# Patient Record
Sex: Female | Born: 1975 | Race: White | Hispanic: No | Marital: Married | Smoking: Never smoker
Health system: Southern US, Community
[De-identification: ages and names within clinical notes are randomized; demographics above are authoritative.]

## PROBLEM LIST (undated history)

## (undated) DIAGNOSIS — R519 Headache, unspecified: Secondary | ICD-10-CM

## (undated) DIAGNOSIS — K219 Gastro-esophageal reflux disease without esophagitis: Secondary | ICD-10-CM

## (undated) DIAGNOSIS — K7581 Nonalcoholic steatohepatitis (NASH): Secondary | ICD-10-CM

## (undated) DIAGNOSIS — E039 Hypothyroidism, unspecified: Secondary | ICD-10-CM

## (undated) DIAGNOSIS — E119 Type 2 diabetes mellitus without complications: Secondary | ICD-10-CM

## (undated) DIAGNOSIS — C801 Malignant (primary) neoplasm, unspecified: Secondary | ICD-10-CM

## (undated) HISTORY — PX: MASTECTOMY: SHX3

## (undated) HISTORY — PX: TUBAL LIGATION: SHX77

---

## 2007-07-25 HISTORY — PX: EYE SURGERY: SHX253

## 2014-11-24 ENCOUNTER — Other Ambulatory Visit: Payer: Self-pay | Admitting: Internal Medicine

## 2014-11-24 DIAGNOSIS — R222 Localized swelling, mass and lump, trunk: Secondary | ICD-10-CM

## 2014-12-10 ENCOUNTER — Ambulatory Visit: Admission: RE | Admit: 2014-12-10 | Payer: 59 | Source: Ambulatory Visit

## 2015-06-22 ENCOUNTER — Emergency Department
Admission: EM | Admit: 2015-06-22 | Discharge: 2015-06-22 | Disposition: A | Discharge status: Home / Self Care | Payer: 59 | Attending: Emergency Medicine | Admitting: Emergency Medicine

## 2015-06-22 ENCOUNTER — Encounter: Payer: Self-pay | Admitting: *Deleted

## 2015-06-22 DIAGNOSIS — Z23 Encounter for immunization: Secondary | ICD-10-CM | POA: Insufficient documentation

## 2015-06-22 DIAGNOSIS — W260XXA Contact with knife, initial encounter: Secondary | ICD-10-CM | POA: Insufficient documentation

## 2015-06-22 DIAGNOSIS — Y93G1 Activity, food preparation and clean up: Secondary | ICD-10-CM | POA: Insufficient documentation

## 2015-06-22 DIAGNOSIS — Y998 Other external cause status: Secondary | ICD-10-CM | POA: Insufficient documentation

## 2015-06-22 DIAGNOSIS — Y92009 Unspecified place in unspecified non-institutional (private) residence as the place of occurrence of the external cause: Secondary | ICD-10-CM | POA: Diagnosis not present

## 2015-06-22 DIAGNOSIS — S61213A Laceration without foreign body of left middle finger without damage to nail, initial encounter: Secondary | ICD-10-CM | POA: Insufficient documentation

## 2015-06-22 DIAGNOSIS — S61219A Laceration without foreign body of unspecified finger without damage to nail, initial encounter: Secondary | ICD-10-CM

## 2015-06-22 MED ORDER — LIDOCAINE HCL (PF) 1 % IJ SOLN
INTRAMUSCULAR | Status: AC
  Filled 2015-06-22: qty 5

## 2015-06-22 MED ORDER — TETANUS-DIPHTH-ACELL PERTUSSIS 5-2.5-18.5 LF-MCG/0.5 IM SUSP: 0.5000 mL | Freq: Once | INTRAMUSCULAR | Status: DC

## 2015-06-22 MED ORDER — LIDOCAINE HCL (PF) 1 % IJ SOLN
10.0000 mL | Freq: Once | INTRAMUSCULAR | Status: AC
  Administered 2015-06-22: 10 mL

## 2015-06-22 MED ORDER — TETANUS-DIPHTHERIA TOXOIDS TD 5-2 LFU IM INJ
0.5000 mL | INJECTION | Freq: Once | INTRAMUSCULAR | Status: AC
  Administered 2015-06-22: 0.5 mL via INTRAMUSCULAR

## 2015-06-22 MED ORDER — TETANUS-DIPHTHERIA TOXOIDS TD 5-2 LFU IM INJ
INJECTION | INTRAMUSCULAR | Status: AC
  Administered 2015-06-22: 0.5 mL via INTRAMUSCULAR
  Filled 2015-06-22: qty 0.5

## 2015-06-22 NOTE — ED Notes (Signed)
Lac eration to left middle finger cut on knife at home

## 2015-06-22 NOTE — ED Provider Notes (Signed)
[facility] Emergency Department Provider Note  ____________________________________________  Time seen: Approximately 9:19 AM  I have reviewed the triage vital signs and the nursing notes.   HISTORY  Chief Complaint Extremity Laceration    HPI Madison Johnson is a 39 y.o. female who presents emergency department for laceration to left middle finger. She states that she was cutting food at home with a sharp knife when she cut her left middle finger in 2 places. She states that she was able control bleeding prior to arrival. She denies any numbness or tingling in extremity. She endorses full range of motion to her finger. She states that last tetanus shot was greater than 6 years ago. At this time there is minimal pain.   History reviewed. No pertinent past medical history.  There are no active problems to display for this patient.   History reviewed. No pertinent past surgical history.  No current outpatient prescriptions on file.  Allergies Review of patient's allergies indicates not on file.  No family history on file.  Social History Social History  Substance Use Topics  . Smoking status: Never Smoker   . Smokeless tobacco: None  . Alcohol Use: Yes    Review of Systems Constitutional: No fever/chills Eyes: No visual changes. ENT: No sore throat. Cardiovascular: Denies chest pain. Respiratory: Denies shortness of breath. Gastrointestinal: No abdominal pain.  No nausea, no vomiting.  No diarrhea.  No constipation. Genitourinary: Negative for dysuria. Musculoskeletal: Negative for back pain. Skin: Negative for rash. Laceration to left third digit. Neurological: Negative for headaches, focal weakness or numbness.  10-point ROS otherwise negative.  ____________________________________________   PHYSICAL EXAM:  VITAL SIGNS: ED Triage Vitals  Enc Vitals Group     BP --      Pulse --      Resp --      Temp --      Temp src --       SpO2 --      Weight --      Height --      Head Cir --      Peak Flow --      Pain Score --      Pain Loc --      Pain Edu? --      Excl. in [place]? --     Constitutional: Alert and oriented. Well appearing and in no acute distress. Eyes: Conjunctivae are normal. PERRL. EOMI. Head: Atraumatic. Nose: No congestion/rhinnorhea. Mouth/Throat: Mucous membranes are moist.  Oropharynx non-erythematous. Neck: No stridor.   Cardiovascular: Normal rate, regular rhythm. Grossly normal heart sounds.  Good peripheral circulation. Respiratory: Normal respiratory effort.  No retractions. Lungs CTAB. Gastrointestinal: Soft and nontender. No distention. No abdominal bruits. No CVA tenderness. Musculoskeletal: No lower extremity tenderness nor edema.  No joint effusions. Neurologic:  Normal speech and language. No gross focal neurologic deficits are appreciated. No gait instability. Skin:  Skin is warm, dry and intact. No rash noted. 2 lacerations noted to the lateral aspect of the left third digit. First laceration PIP joint. Full range of motion in joint. Laceration margins are clean, no foreign body visible, no bleeding. Laceration is approximately 4 cm in length. Second laceration is over the distal phalanx. No tendon involvement, edges are clean, no visible foreign body. Bleeding is controlled. Laceration is approximately 2 cm in length. Psychiatric: Mood and affect are normal. Speech and behavior are normal.  ____________________________________________   LABS (all labs ordered are listed, but only  abnormal results are displayed)  Labs Reviewed - No data to display ____________________________________________  EKG   ____________________________________________  RADIOLOGY   ____________________________________________   PROCEDURES  Procedure(s) performed: Yes, laceration repair, see procedure note(s).   LACERATION REPAIR Performed by: Darletta Moll Authorized by: Charline Bills Destan Franchini Consent: Verbal consent obtained. Risks and benefits: risks, benefits and alternatives were discussed Consent given by: patient Patient identity confirmed: provided demographic data Prepped and Draped in normal sterile fashion Wound explored  Laceration Location: The third digit left hand, over PIP joint  Laceration Length: For cm  No Foreign Bodies seen or palpated  Anesthesia: Digital block   Local anesthetic: lidocaine 1 % without epinephrine  Anesthetic total: 5 ml  Irrigation method: syringe Amount of cleaning: standard  Skin closure: 4-0 Ethilon sutures   Number of sutures: 7   Technique: Simple interrupted   Patient tolerance: Patient tolerated the procedure well with no immediate complications.  LACERATION REPAIR Performed by: Darletta Moll Authorized by: Charline Bills Trevino Wyatt Consent: Verbal consent obtained. Risks and benefits: risks, benefits and alternatives were discussed Consent given by: patient Patient identity confirmed: provided demographic data Prepped and Draped in normal sterile fashion Wound explored  Laceration Location: Left third digit, over distal phalanx  Laceration Length: 2 cm  No Foreign Bodies seen or palpated  Anesthesia: Digital block   Local anesthetic: lidocaine 1 % without epinephrine  Anesthetic total: 5 ml  Irrigation method: syringe Amount of cleaning: standard  Skin closure: 4-0 Ethilon sutures   Number of sutures: 3   Technique: Supple interrupted   Patient tolerance: Patient tolerated the procedure well with no immediate complications.    Critical Care performed: No  ____________________________________________   INITIAL IMPRESSION / ASSESSMENT AND PLAN / ED COURSE  Pertinent labs & imaging results that were available during my care of the patient were reviewed by me and considered in my medical decision making (see chart for details).  Vision presents emergency department with 2  lacerations to the left third digit. Wound was cleansed and inspected no visible foreign body. Wounds were closed with 4-0 Ethilon sutures. Proximal laceration was closed with 7 simple interrupted sutures, distal laceration was closed with 3 simple interrupted sutures. Patient was advised to keep area clean and dry and monitor for signs of infection. Patient is to follow-up with primary care or urgent care for suture removal in 7-10 days. Patient verbalizes understanding of diagnosis and treatment plan and verbalizes compliance to same. ____________________________________________   FINAL CLINICAL IMPRESSION(S) / ED DIAGNOSES  Final diagnoses:  Laceration of finger, initial encounter      Darletta Moll, PA-C 06/22/15 [place], MD 06/22/15 1406

## 2015-06-22 NOTE — ED Notes (Signed)
Lac to left middle finger

## 2015-06-22 NOTE — Discharge Instructions (Signed)
Laceration Care, Adult °A laceration is a cut that goes through all of the layers of the skin and into the tissue that is right under the skin. Some lacerations heal on their own. Others need to be closed with stitches (sutures), staples, skin adhesive strips, or skin glue. Proper laceration care minimizes the risk of infection and helps the laceration to heal better. °HOW TO CARE FOR YOUR LACERATION °If sutures or staples were used: °· Keep the wound clean and dry. °· If you were given a bandage (dressing), you should change it at least one time per day or as told by your health care provider. You should also change it if it becomes wet or dirty. °· Keep the wound completely dry for the first 24 hours or as told by your health care provider. After that time, you may shower or bathe. However, make sure that the wound is not soaked in water until after the sutures or staples have been removed. °· Clean the wound one time each day or as told by your health care provider: °· Wash the wound with soap and water. °· Rinse the wound with water to remove all soap. °· Pat the wound dry with a clean towel. Do not rub the wound. °· After cleaning the wound, apply a thin layer of antibiotic ointment as told by your health care provider. This will help to prevent infection and keep the dressing from sticking to the wound. °· Have the sutures or staples removed as told by your health care provider. °If skin adhesive strips were used: °· Keep the wound clean and dry. °· If you were given a bandage (dressing), you should change it at least one time per day or as told by your health care provider. You should also change it if it becomes dirty or wet. °· Do not get the skin adhesive strips wet. You may shower or bathe, but be careful to keep the wound dry. °· If the wound gets wet, pat it dry with a clean towel. Do not rub the wound. °· Skin adhesive strips fall off on their own. You may trim the strips as the wound heals. Do not  remove skin adhesive strips that are still stuck to the wound. They will fall off in time. °If skin glue was used: °· Try to keep the wound dry, but you may briefly wet it in the shower or bath. Do not soak the wound in water, such as by swimming. °· After you have showered or bathed, gently pat the wound dry with a clean towel. Do not rub the wound. °· Do not do any activities that will make you sweat heavily until the skin glue has fallen off on its own. °· Do not apply liquid, cream, or ointment medicine to the wound while the skin glue is in place. Using those may loosen the film before the wound has healed. °· If you were given a bandage (dressing), you should change it at least one time per day or as told by your health care provider. You should also change it if it becomes dirty or wet. °· If a dressing is placed over the wound, be careful not to apply tape directly over the skin glue. Doing that may cause the glue to be pulled off before the wound has healed. °· Do not pick at the glue. The skin glue usually remains in place for 5-10 days, then it falls off of the skin. °General Instructions °· Take over-the-counter and prescription   medicines only as told by your health care provider. °· If you were prescribed an antibiotic medicine or ointment, take or apply it as told by your doctor. Do not stop using it even if your condition improves. °· To help prevent scarring, make sure to cover your wound with sunscreen whenever you are outside after stitches are removed, after adhesive strips are removed, or when glue remains in place and the wound is healed. Make sure to wear a sunscreen of at least 30 SPF. °· Do not scratch or pick at the wound. °· Keep all follow-up visits as told by your health care provider. This is important. °· Check your wound every day for signs of infection. Watch for: °· Redness, swelling, or pain. °· Fluid, blood, or pus. °· Raise (elevate) the injured area above the level of your heart  while you are sitting or lying down, if possible. °SEEK MEDICAL CARE IF: °· You received a tetanus shot and you have swelling, severe pain, redness, or bleeding at the injection site. °· You have a fever. °· A wound that was closed breaks open. °· You notice a bad smell coming from your wound or your dressing. °· You notice something coming out of the wound, such as wood or glass. °· Your pain is not controlled with medicine. °· You have increased redness, swelling, or pain at the site of your wound. °· You have fluid, blood, or pus coming from your wound. °· You notice a change in the color of your skin near your wound. °· You need to change the dressing frequently due to fluid, blood, or pus draining from the wound. °· You develop a new rash. °· You develop numbness around the wound. °SEEK IMMEDIATE MEDICAL CARE IF: °· You develop severe swelling around the wound. °· Your pain suddenly increases and is severe. °· You develop painful lumps near the wound or on skin that is anywhere on your body. °· You have a red streak going away from your wound. °· The wound is on your hand or foot and you cannot properly move a finger or toe. °· The wound is on your hand or foot and you notice that your fingers or toes look pale or bluish. °  °This information is not intended to replace advice given to you by your health care provider. Make sure you discuss any questions you have with your health care provider. °  °Document Released: 07/10/2005 Document Revised: 11/24/2014 Document Reviewed: 07/06/2014 °Elsevier Interactive Patient Education ©2016 [employer] ° °Stitches, Staples, or Adhesive Wound Closure °Health care providers use stitches (sutures), staples, and certain glue (skin adhesives) to hold skin together while it heals (wound closure). You may need this treatment after you have surgery or if you cut your skin accidentally. These methods help your skin to heal more quickly and make it less likely that you will have  a scar. A wound may take several months to heal completely. °The type of wound you have determines when your wound gets closed. In most cases, the wound is closed as soon as possible (primary skin closure). Sometimes, closure is delayed so the wound can be cleaned and allowed to heal naturally. This reduces the chance of infection. Delayed closure may be needed if your wound: °· Is caused by a bite. °· Happened more than 6 hours ago. °· Involves loss of skin or the tissues under the skin. °· Has dirt or debris in it that cannot be removed. °· Is infected. °WHAT   ARE THE DIFFERENT KINDS OF WOUND CLOSURES? °There are many options for wound closure. The one that your health care provider uses depends on how deep and how large your wound is. °Adhesive Glue °To use this type of glue to close a wound, your health care provider holds the edges of the wound together and paints the glue on the surface of your skin. You may need more than one layer of glue. Then the wound may be covered with a light bandage (dressing). °This type of skin closure may be used for small wounds that are not deep (superficial). Using glue for wound closure is less painful than other methods. It does not require a medicine that numbs the area (local anesthetic). This method also leaves nothing to be removed. Adhesive glue is often used for children and on facial wounds. °Adhesive glue cannot be used for wounds that are deep, uneven, or bleeding. It is not used inside of a wound.  °Adhesive Strips °These strips are made of sticky (adhesive), porous paper. They are applied across your skin edges like a regular adhesive bandage. You leave them on until they fall off. °Adhesive strips may be used to close very superficial wounds. They may also be used along with sutures to improve the closure of your skin edges.  °Sutures °Sutures are the oldest method of wound closure. Sutures can be made from natural substances, such as silk, or from synthetic  materials, such as nylon and steel. They can be made from a material that your body can break down as your wound heals (absorbable), or they can be made from a material that needs to be removed from your skin (nonabsorbable). They come in many different strengths and sizes. °Your health care provider attaches the sutures to a steel needle on one end. Sutures can be passed through your skin, or through the tissues beneath your skin. Then they are tied and cut. Your skin edges may be closed in one continuous stitch or in separate stitches. °Sutures are strong and can be used for all kinds of wounds. Absorbable sutures may be used to close tissues under the skin. The disadvantage of sutures is that they may cause skin reactions that lead to infection. Nonabsorbable sutures need to be removed. °Staples °When surgical staples are used to close a wound, the edges of your skin on both sides of the wound are brought close together. A staple is placed across the wound, and an instrument secures the edges together. Staples are often used to close surgical cuts (incisions). °Staples are faster to use than sutures, and they cause less skin reaction. Staples need to be removed using a tool that bends the staples away from your skin. °HOW DO I CARE FOR MY WOUND CLOSURE? °· Take medicines only as directed by your health care provider. °· If you were prescribed an antibiotic medicine for your wound, finish it all even if you start to feel better. °· Use ointments or creams only as directed by your health care provider. °· Wash your hands with soap and water before and after touching your wound. °· Do not soak your wound in water. Do not take baths, swim, or use a hot tub until your health care provider approves. °· Ask your health care provider when you can start showering. Cover your wound if directed by your health care provider. °· Do not take out your own sutures or staples. °· Do not pick at your wound. Picking can cause an  infection. °·   Keep all follow-up visits as directed by your health care provider. This is important. °HOW LONG WILL I HAVE MY WOUND CLOSURE? °· Leave adhesive glue on your skin until the glue peels away. °· Leave adhesive strips on your skin until the strips fall off. °· Absorbable sutures will dissolve within several days. °· Nonabsorbable sutures and staples must be removed. The location of the wound will determine how long they stay in. This can range from several days to a couple of weeks. °WHEN SHOULD I SEEK HELP FOR MY WOUND CLOSURE? °Contact your health care provider if: °· You have a fever. °· You have chills. °· You have drainage, redness, swelling, or pain at your wound. °· There is a bad smell coming from your wound. °· The skin edges of your wound start to separate after your sutures have been removed. °· Your wound becomes thick, raised, and darker in color after your sutures come out (scarring). °  °This information is not intended to replace advice given to you by your health care provider. Make sure you discuss any questions you have with your health care provider. °  °Document Released: 04/04/2001 Document Revised: 07/31/2014 Document Reviewed: 12/17/2013 °Elsevier Interactive Patient Education ©2016 [employer] ° °

## 2015-06-22 NOTE — ED Notes (Signed)
dsy to left middle finger

## 2015-10-29 ENCOUNTER — Other Ambulatory Visit: Payer: Self-pay | Admitting: Internal Medicine

## 2015-10-29 DIAGNOSIS — Z0000 Encounter for general adult medical examination without abnormal findings: Secondary | ICD-10-CM

## 2015-11-15 ENCOUNTER — Ambulatory Visit
Admission: RE | Admit: 2015-11-15 | Discharge: 2015-11-15 | Disposition: A | Discharge status: Home / Self Care | Payer: 59 | Source: Ambulatory Visit | Attending: Internal Medicine | Admitting: Internal Medicine

## 2015-11-15 DIAGNOSIS — R921 Mammographic calcification found on diagnostic imaging of breast: Secondary | ICD-10-CM | POA: Insufficient documentation

## 2015-11-15 DIAGNOSIS — Z0000 Encounter for general adult medical examination without abnormal findings: Secondary | ICD-10-CM

## 2015-11-15 DIAGNOSIS — Z1231 Encounter for screening mammogram for malignant neoplasm of breast: Secondary | ICD-10-CM | POA: Diagnosis present

## 2015-11-17 ENCOUNTER — Other Ambulatory Visit: Payer: Self-pay | Admitting: Internal Medicine

## 2015-11-17 DIAGNOSIS — R928 Other abnormal and inconclusive findings on diagnostic imaging of breast: Secondary | ICD-10-CM

## 2015-11-23 ENCOUNTER — Other Ambulatory Visit: Payer: Self-pay | Admitting: Internal Medicine

## 2015-11-23 ENCOUNTER — Ambulatory Visit
Admission: RE | Admit: 2015-11-23 | Discharge: 2015-11-23 | Disposition: A | Discharge status: Home / Self Care | Payer: 59 | Source: Ambulatory Visit | Attending: Internal Medicine | Admitting: Internal Medicine

## 2015-11-23 DIAGNOSIS — R928 Other abnormal and inconclusive findings on diagnostic imaging of breast: Secondary | ICD-10-CM

## 2015-11-23 DIAGNOSIS — R921 Mammographic calcification found on diagnostic imaging of breast: Secondary | ICD-10-CM | POA: Diagnosis present

## 2015-11-24 ENCOUNTER — Other Ambulatory Visit: Payer: Self-pay | Admitting: Internal Medicine

## 2015-11-24 DIAGNOSIS — R920 Mammographic microcalcification found on diagnostic imaging of breast: Secondary | ICD-10-CM

## 2015-11-24 DIAGNOSIS — N63 Unspecified lump in unspecified breast: Secondary | ICD-10-CM

## 2015-12-02 ENCOUNTER — Ambulatory Visit
Admission: RE | Admit: 2015-12-02 | Discharge: 2015-12-02 | Disposition: A | Discharge status: Home / Self Care | Payer: 59 | Source: Ambulatory Visit | Attending: Internal Medicine | Admitting: Internal Medicine

## 2015-12-02 DIAGNOSIS — N63 Unspecified lump in unspecified breast: Secondary | ICD-10-CM

## 2015-12-02 DIAGNOSIS — R920 Mammographic microcalcification found on diagnostic imaging of breast: Secondary | ICD-10-CM

## 2015-12-02 DIAGNOSIS — D0581 Other specified type of carcinoma in situ of right breast: Secondary | ICD-10-CM | POA: Diagnosis not present

## 2015-12-02 DIAGNOSIS — R921 Mammographic calcification found on diagnostic imaging of breast: Secondary | ICD-10-CM | POA: Diagnosis present

## 2015-12-02 HISTORY — PX: BREAST BIOPSY: SHX20

## 2015-12-03 DIAGNOSIS — Z853 Personal history of malignant neoplasm of breast: Secondary | ICD-10-CM | POA: Insufficient documentation

## 2015-12-03 LAB — SURGICAL PATHOLOGY

## 2015-12-07 ENCOUNTER — Other Ambulatory Visit: Payer: Self-pay | Admitting: Internal Medicine

## 2015-12-07 ENCOUNTER — Other Ambulatory Visit (HOSPITAL_COMMUNITY): Payer: Self-pay | Admitting: Internal Medicine

## 2015-12-07 DIAGNOSIS — R1032 Left lower quadrant pain: Principal | ICD-10-CM

## 2015-12-07 DIAGNOSIS — R1031 Right lower quadrant pain: Secondary | ICD-10-CM

## 2015-12-08 NOTE — Progress Notes (Signed)
  Oncology Nurse Navigator Documentation  Navigator Location: [facility]Med Onc (12/08/15 1600) Navigator Encounter Type: Initial MedOnc (12/08/15 1600)   Abnormal Finding Date: 11/23/15 (12/08/15 1600) Confirmed Diagnosis Date: 12/02/15 (12/08/15 1600)     Patient Visit Type: Walk-in (12/08/15 1600) Treatment Phase: Pre-Tx/Tx Discussion (12/08/15 1600) Barriers/Navigation Needs: Financial;Education;Coordination of Care (12/08/15 1600) Education: Coping with Diagnosis/ Prognosis;Newly Diagnosed Cancer Education;Concerns with Finances/ Eligibility;lymphedema class (12/08/15 1600) Interventions: Education Method;Coordination of Care (12/08/15 1600)        Support Groups/Services: Kids Can;Breast Support Group (12/08/15 1600)   Acuity: Level 2 (12/08/15 1600)   Acuity Level 2: Ongoing guidance and education throughout treatment as needed;Initial guidance, education and coordination as needed (12/08/15 1600)     Time Spent with Patient: 30 (12/08/15 1600)   Tanya Nones RN phoned patient to introduce navigation service. Patient came today to pick up Breast Cancer Treatment Handbook/folder with hospital services. She is scheduled for [place] on 12/14/15 with Dr. Grayland Ormond.  Has concerns over children understanding diagnosis.  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   Encouraged her to bring spouse to initial Med-Onc appointment to increase his understanding of treatment plan.  Navigatorto accompany to this appointment.

## 2015-12-14 ENCOUNTER — Encounter: Payer: Self-pay | Admitting: *Deleted

## 2015-12-14 ENCOUNTER — Encounter: Payer: Self-pay | Admitting: Oncology

## 2015-12-14 ENCOUNTER — Inpatient Hospital Stay: Payer: 59 | Attending: Oncology | Admitting: Oncology

## 2015-12-14 VITALS — BP 124/83 | HR 78 | Temp 98.1°F | Resp 18 | Wt 163.1 lb

## 2015-12-14 DIAGNOSIS — Z800 Family history of malignant neoplasm of digestive organs: Secondary | ICD-10-CM | POA: Insufficient documentation

## 2015-12-14 DIAGNOSIS — D0511 Intraductal carcinoma in situ of right breast: Secondary | ICD-10-CM | POA: Insufficient documentation

## 2015-12-14 DIAGNOSIS — R921 Mammographic calcification found on diagnostic imaging of breast: Secondary | ICD-10-CM | POA: Diagnosis not present

## 2015-12-14 NOTE — Progress Notes (Signed)
[place]  Telephone:(336) [phone] Fax:(336) [phone]  ID: Madison Johnson OB: 04/07/1976  MR#: [id number]  HXT#:[id number]  Patient Care Team: No Pcp Per Patient as PCP - General (General Practice)  CHIEF COMPLAINT:  Chief Complaint  Patient presents with  . New Evaluation    INTERVAL HISTORY: Patient is a 40 year old female who had a routine yearly follow-up with her primary care physician and noted an abnormality on breast exam. 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 She also had an abnormality in her left breast, biopsy was negative for malignancy. She is referred to clinic today for further evaluation and treatment options. She currently feels well and is asymptomatic. She has no neurologic complaints. She denies any recent fevers or illnesses. She has a good appetite and denies any weight loss. She denies any pain. She denies any chest pain or shortness of breath. She denies any nausea, vomiting, constipation, or diarrhea. She has no urinary complaints. Patient feels at her baseline and offers no specific complaints today.  REVIEW OF SYSTEMS:   Review of Systems  Constitutional: Negative.  Negative for fever, weight loss and malaise/fatigue.  Respiratory: Negative.  Negative for shortness of breath.   Cardiovascular: Negative.  Negative for chest pain.  Gastrointestinal: Negative.  Negative for abdominal pain.  Genitourinary: Negative.   Musculoskeletal: Negative.   Neurological: Negative.  Negative for weakness.  Psychiatric/Behavioral: Negative.     As per HPI. Otherwise, a complete review of systems is negatve.  PAST MEDICAL HISTORY: No past medical history on file.  PAST SURGICAL HISTORY: Past Surgical History  Procedure Laterality Date  . Breast biopsy Right 12/02/2015    path pending, stereo x2 areas  . Breast biopsy Left 12/02/2015    [place] bx path pending  . Tubal ligation      FAMILY  HISTORY:Grandfather and maternal aunt with stomach cancer.     ADVANCED DIRECTIVES:    HEALTH MAINTENANCE: Social History  Substance Use Topics  . Smoking status: Never Smoker   . Smokeless tobacco: Not on file  . Alcohol Use: Yes     Colonoscopy:  PAP:  Bone density:  Lipid panel:  No Known Allergies  No current outpatient prescriptions on file.   No current facility-administered medications for this visit.    OBJECTIVE: Filed Vitals:   12/14/15 0946  BP: 124/83  Pulse: 78  Temp: 98.1 F (36.7 C)  Resp: 18     Body mass index is 31.86 kg/(m^2).    ECOG FS:0 - Asymptomatic  General: Well-developed, well-nourished, no acute distress. Eyes: Pink conjunctiva, anicteric sclera. HEENT: Normocephalic, moist mucous membranes, clear oropharnyx. Breasts: Patient requested exam be deferred today. Lungs: Clear to auscultation bilaterally. Heart: Regular rate and rhythm. No rubs, murmurs, or gallops. Abdomen: Soft, nontender, nondistended. No organomegaly noted, normoactive bowel sounds. Musculoskeletal: No edema, cyanosis, or clubbing. Neuro: Alert, answering all questions appropriately. Cranial nerves grossly intact. Skin: No rashes or petechiae noted. Psych: Normal affect. Lymphatics: No cervical, calvicular, axillary or inguinal LAD.   LAB RESULTS:  No results found for: NA, K, CL, CO2, GLUCOSE, BUN, CREATININE, CALCIUM, PROT, ALBUMIN, AST, ALT, ALKPHOS, BILITOT, GFRNONAA, GFRAA  No results found for: WBC, NEUTROABS, HGB, HCT, MCV, PLT   STUDIES: Mm Digital Diagnostic Bilat  12/02/2015  CLINICAL DATA:  Status post stereotactic guided biopsies of the right breast and ultrasound-guided biopsy of the left breast EXAM: DIAGNOSTIC BILATERAL MAMMOGRAM POST STEREOTACTIC AND ULTRASOUND BIOPSIES COMPARISON:  Previous  exam(s). FINDINGS: Mammographic images were obtained following stereotactic guided biopsies of 2 groups of calcifications within the right breast and  ultrasound-guided biopsy of an indeterminate left breast mass. Post biopsy mammogram demonstrates the top hat shaped biopsy marker to be in the expected location within the upper, outer right breast, the cylinder-shaped biopsy marker to be in the expected location within the lower, outer right breast, and the wing shaped biopsy marker to be in the expected location within the medial left breast. IMPRESSION: Appropriate marker position as above. Final Assessment: Post Procedure Mammograms for Marker Placement Electronically Signed   By: Pamelia Hoit M.D.   On: 12/02/2015 13:13   Mm Digital Screening Bilateral  11/15/2015  CLINICAL DATA:  Screening. EXAM: DIGITAL SCREENING BILATERAL MAMMOGRAM WITH CAD COMPARISON:  None ACR Breast Density Category b: There are scattered areas of fibroglandular density. FINDINGS: In the right breast, calcifications warrant further evaluation with magnified views. In the left breast, no findings suspicious for malignancy. Images were processed with CAD. IMPRESSION: Further evaluation is suggested for calcifications in the right breast. RECOMMENDATION: Diagnostic mammogram of the right breast. (Code:[id number]) The patient will be contacted regarding the findings, and additional imaging will be scheduled. BI-RADS CATEGORY  0: Incomplete. Need additional imaging evaluation and/or prior mammograms for comparison. Electronically Signed   By: Lajean Manes M.D.   On: 11/15/2015 12:23   US Breast Ltd Uni Left Inc Axilla  11/23/2015  CLINICAL DATA:  Recall from baseline screening mammography. EXAM: 2D DIGITAL DIAGNOSTIC BILATERAL MAMMOGRAM WITH CAD AND ADJUNCT TOMO ULTRASOUND LEFT BREAST COMPARISON:  11/15/2015 baseline screening mammogram. ACR Breast Density Category b: There are scattered areas of fibroglandular density. FINDINGS: Magnification views of the right breast demonstrate linear, branching, pleomorphic segmental calcifications to be present within the upper-outer quadrant of the  right breast. These span 4.0 cm. In addition, there are similar, linear, branching, pleomorphic segmental calcifications present within the lower outer quadrant of the right breast which span 5.5 cm. These are worrisome for high-grade DCIS and tissue sampling via stereotactic biopsy of representative calcifications from each of these 2 different areas is recommended. In addition, there is an oval mass with an angular anterior margin located within the upper inner quadrant of the left breast (middle 1/3). Mammographic images were processed with CAD. On physical exam, there is no discrete palpable abnormality within the medial left breast or upper inner quadrant of the left breast. Targeted ultrasound is performed, showing an oval, parallel hypoechoic lesion with septations and multiple cystic areas located within the left breast at the 9:30 o'clock position 7 cm from the nipple measuring 7 x 6 x 3 mm in size. Approximately 1 cm superior to this is a second similar oval, septated, cystic complex located at the 10 o'clock position 7 cm from the nipple measuring 8 x 6 x 5 mm in size. These may represent areas of apocrine metaplasia [facility]). However, given the calcifications within the right breast, I recommend ultrasound-guided core biopsy of 1 of these 2 adjacent lesions to exclude DCIS. Ultrasound of the right and left axillary regions demonstrates normal axillary contents and no evidence for adenopathy. IMPRESSION: 1. 2 large areas of worrisome segmental branching pleomorphic calcifications. One of these groups of calcifications is located within the upper-outer quadrant of the right breast (spanning 4 cm) and the second group is located within the lower outer quadrant of the right breast (spanning 5.5 cm). Stereotactic biopsy of each of these groups of calcifications is recommended. This has been discussed  with the patient. 2. Adjacent complex cystic lesions located within the left breast at the 9:30 and 10 o'clock  positions 7 cm from the nipple. Tissue sampling of 1 of these areas via ultrasound-guided core biopsy is recommended. RECOMMENDATION: 1. Stereotactic biopsy of 2 areas of calcification within the right breast. 2. Ultrasound-guided core biopsy of 1 of the adjacent complex cystic lesions within the left breast located at the 9:30 to 10 o'clock position 7 cm from the nipple. I have discussed the findings and recommendations with the patient. Results were also provided in writing at the conclusion of the visit. If applicable, a reminder letter will be sent to the patient regarding the next appointment. BI-RADS CATEGORY  5: Highly suggestive of malignancy. Electronically Signed   By: Altamese Cabal M.D.   On: 11/23/2015 14:35   Mm Diag Breast Tomo Bilateral  11/23/2015  CLINICAL DATA:  Recall from baseline screening mammography. EXAM: 2D DIGITAL DIAGNOSTIC BILATERAL MAMMOGRAM WITH CAD AND ADJUNCT TOMO ULTRASOUND LEFT BREAST COMPARISON:  11/15/2015 baseline screening mammogram. ACR Breast Density Category b: There are scattered areas of fibroglandular density. FINDINGS: Magnification views of the right breast demonstrate linear, branching, pleomorphic segmental calcifications to be present within the upper-outer quadrant of the right breast. These span 4.0 cm. In addition, there are similar, linear, branching, pleomorphic segmental calcifications present within the lower outer quadrant of the right breast which span 5.5 cm. These are worrisome for high-grade DCIS and tissue sampling via stereotactic biopsy of representative calcifications from each of these 2 different areas is recommended. In addition, there is an oval mass with an angular anterior margin located within the upper inner quadrant of the left breast (middle 1/3). Mammographic images were processed with CAD. On physical exam, there is no discrete palpable abnormality within the medial left breast or upper inner quadrant of the left breast. Targeted  ultrasound is performed, showing an oval, parallel hypoechoic lesion with septations and multiple cystic areas located within the left breast at the 9:30 o'clock position 7 cm from the nipple measuring 7 x 6 x 3 mm in size. Approximately 1 cm superior to this is a second similar oval, septated, cystic complex located at the 10 o'clock position 7 cm from the nipple measuring 8 x 6 x 5 mm in size. These may represent areas of apocrine metaplasia [facility]). However, given the calcifications within the right breast, I recommend ultrasound-guided core biopsy of 1 of these 2 adjacent lesions to exclude DCIS. Ultrasound of the right and left axillary regions demonstrates normal axillary contents and no evidence for adenopathy. IMPRESSION: 1. 2 large areas of worrisome segmental branching pleomorphic calcifications. One of these groups of calcifications is located within the upper-outer quadrant of the right breast (spanning 4 cm) and the second group is located within the lower outer quadrant of the right breast (spanning 5.5 cm). Stereotactic biopsy of each of these groups of calcifications is recommended. This has been discussed with the patient. 2. Adjacent complex cystic lesions located within the left breast at the 9:30 and 10 o'clock positions 7 cm from the nipple. Tissue sampling of 1 of these areas via ultrasound-guided core biopsy is recommended. RECOMMENDATION: 1. Stereotactic biopsy of 2 areas of calcification within the right breast. 2. Ultrasound-guided core biopsy of 1 of the adjacent complex cystic lesions within the left breast located at the 9:30 to 10 o'clock position 7 cm from the nipple. I have discussed the findings and recommendations with the patient. Results were also provided in writing  at the conclusion of the visit. If applicable, a reminder letter will be sent to the patient regarding the next appointment. BI-RADS CATEGORY  5: Highly suggestive of malignancy. Electronically Signed   By: Altamese Cabal M.D.   On: 11/23/2015 14:35   [place] Lt Breast Bx W Loc Dev 1st Lesion Img Bx Spec US Guide  12/08/2015  ADDENDUM REPORT: 12/07/2015 16:53 ADDENDUM: Pathology of the left breast ultrasound guided biopsy revealed BREAST MASS, LEFT 9:30 AND 7 CM FN; BIOPSY: NODULAR FIBROCYSTIC CHANGE. NEGATIVE FOR ATYPIA AND MALIGNANCY. This was found to be concordant by Dr. Raul Del impression and notes and confirmed by Dr. Jetta Lout. Recommendations: Recommend a six month follow-up left breast ultrasound. The patient was contacted by phone by Jetta Lout, [place] on 12/07/15 for a post biopsy check. She stated she did well following the biopsy with no bleeding, bruising, or hematoma. All of her questions were answered. She stated results had been relayed to her by her physician, Dr. Candiss Norse on Friday, 12/03/15. She has an appointment with Dr. Grayland Ormond at [facility] on Tuesday, 12/14/15 at 10:00 AM for consultation regarding the right breast pathology. Addendum by Jetta Lout, RRA on 12/07/15. Electronically Signed   By: Fidela Salisbury M.D.   On: 12/07/2015 16:53  12/08/2015  CLINICAL DATA:  Indeterminate left breast mass at 9:30, 7 cm from the nipple. EXAM: ULTRASOUND GUIDED LEFT BREAST CORE NEEDLE BIOPSY COMPARISON:  Previous exam(s). FINDINGS: I met with the patient and we discussed the procedure of ultrasound-guided biopsy, including benefits and alternatives. We discussed the high likelihood of a successful procedure. We discussed the risks of the procedure, including infection, bleeding, tissue injury, clip migration, and inadequate sampling. Informed written consent was given. The usual time-out protocol was performed immediately prior to the procedure. Using sterile technique and 1% Lidocaine as local anesthetic, under direct ultrasound visualization, a 12 gauge spring-loaded device was used to perform biopsy of an indeterminate left breast mass at 9:30, 7 cm from the nipple using a medial to lateral  approach. At the conclusion of the procedure a wing shaped tissue marker clip was deployed into the biopsy cavity. Follow up 2 view mammogram was performed and dictated separately. IMPRESSION: Ultrasound guided biopsy of an indeterminate left breast mass at 9:30, 7 cm from the nipple. No apparent complications. Electronically Signed: By: Pamelia Hoit M.D. 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 These findings were communicated to [facility] in Dr. Keturah Barre office on 12/03/2015 by the pathologist. This was found to be concordant with Dr. Raul Del impression and notes. Recommendations:  Surgical and oncology referral. The patient was contacted by phone on 12/07/15 by Jetta Lout, [place] for a post biopsy check. She stated she did well following the biopsy with no bleeding, bruising, or hematoma. All of her questions were answered. She was encouraged to call the [place] [facility] with any further questions or concerns. Results have been given to the patient by Dr. Candiss Norse. The patient stated she has an appointment on Tuesday, Dec 14, 2015 at 10:00 AM with Dr. Grayland Ormond, oncologist at [facility]. Addendum by Jetta Lout, RRA on 12/07/15. Electronically Signed   By: Linwood Dibbles.D.  On: 12/07/2015 16:48  12/08/2015  CLINICAL DATA:  Suspicious right breast calcifications. Biopsy of 2 areas of calcifications within the right breast was recommended. EXAM: RIGHT BREAST STEREOTACTIC CORE NEEDLE BIOPSY COMPARISON:  Previous exams. FINDINGS: The patient and I discussed the procedure of stereotactic-guided biopsy including benefits and alternatives. We discussed the high  likelihood of a successful procedure. We discussed the risks of the procedure including infection, bleeding, tissue injury, clip migration, and inadequate sampling. Informed written consent was given. The usual time out protocol was performed immediately prior to the procedure. Using sterile technique and 1% Lidocaine as local anesthetic, under stereotactic guidance, a 9 gauge vacuum assist device was used to perform core needle biopsy of calcifications in the upper, outer quadrant of the right breast using a lateral to medial approach. Specimen radiograph was performed showing calcifications within the specimen. Specimens with calcifications are identified for pathology. At the conclusion of the procedure, a top hat shaped tissue marker clip was deployed into the biopsy cavity. Follow-up 2-view mammogram was performed and dictated separately. IMPRESSION: Stereotactic-guided biopsy of calcifications within the upper, outer right breast. No apparent complications. Electronically Signed: By: Pamelia Hoit M.D. 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 These findings were communicated to [facility] in Dr. Keturah Barre office on 12/03/2015. This was found to be concordant with Dr. Raul Del impression and notes. Recommendations:  Surgical and oncology referral. The patient was contacted by Jetta Lout, [place] on 12/07/15 for a post biopsy check. She stated she did well following the biopsy with no bleeding, bruising, or hematoma. All of her questions were answered. She was encouraged to call the [place] [facility] with any further questions or concerns. She stated she had been notified of the results  by her physician on Friday, Dec 03, 2015. She has an appointment with Dr. Grayland Ormond, oncologist at [facility] on Tuesday, 12/14/15 at 10:00 AM. Addendum by Jetta Lout, RRA on 12/07/15. Electronically Signed   By: Fidela Salisbury M.D.   On: 12/07/2015 16:47  12/08/2015  CLINICAL DATA:  Suspicious right breast calcifications. Biopsy of 2 areas of calcifications within the right breast was recommended. EXAM: RIGHT BREAST STEREOTACTIC CORE NEEDLE BIOPSY COMPARISON:  Previous exams. FINDINGS: The patient and I discussed the procedure of stereotactic-guided biopsy including benefits and alternatives. We discussed the high likelihood of a successful procedure. We discussed the risks of the procedure including infection, bleeding, tissue injury, clip migration, and inadequate sampling. Informed written consent was given. The usual time out protocol was performed immediately prior to the procedure. Using sterile technique and 1% Lidocaine as local anesthetic, under stereotactic guidance, a 9 gauge vacuum assist device was used to perform core needle biopsy of calcifications in the lower, outer quadrant of the right breast using a lateral to medial approach. Specimen radiograph was performed showing calcifications within the specimen. Specimens with calcifications are identified for pathology. At the conclusion of the procedure, a cylinder-shaped tissue marker clip was deployed into the biopsy cavity. Follow-up 2-view mammogram was performed and dictated separately. IMPRESSION: Stereotactic-guided biopsy of calcifications within the lower, outer right breast. No apparent complications. Electronically Signed: By: Pamelia Hoit M.D. On: 12/02/2015 13:10    ASSESSMENT:  Multifocal right breast DCIS.  PLAN:    1. DCIS: Imaging and pathology discussed at breast conference. Had lengthy discussion with the patient on whether to pursue lumpectomy possibly 2 in the same breast which would also likely  include additional biopsies to ensure the extensive disease seen on mammogram is positive or possibly total mastectomy. Also discussed her options of immediate reconstruction as well as consideration of bilateral mastectomy. Patient has indicated that she will likely pursue mastectomy, but will further discuss details of this as well as reconstruction with her surgeon. Based on pathology results from biopsy, she has a noninvasive DCIS and therefore would not require chemotherapy. This diagnosis may change after her surgery. She also may or may not need XRT depending on whether she pursues full mastectomy. Finally, patient will require tamoxifen for 5 years. 2. Genetic testing: Blood work was sent to assess for underlying BCRA 1 and 2 given patient's age. 3. Disposition: Patient will follow-up in several weeks after her surgery to discuss her final pathology results and additional treatment planning.  Approximately 45 minutes was spent in discussion of which 50% was consultation.  Patient expressed understanding and was in agreement with this plan. She also understands that She can call clinic at any time with any questions, concerns, or complaints.    Lloyd Huger, MD   12/14/2015 3:36 PM

## 2015-12-14 NOTE — Progress Notes (Signed)
  Oncology Nurse Navigator Documentation  Navigator Location: [facility]-Med Onc (12/14/15 1400) Navigator Encounter Type: Initial MedOnc (12/14/15 1400)             Treatment Phase: Pre-Tx/Tx Discussion (12/14/15 1400) Barriers/Navigation Needs: Financial;Education;Coordination of Care (12/14/15 1400) Education: Understanding Cancer/ Treatment Options;Preparing for Upcoming Surgery/ Treatment (12/14/15 1400) Interventions: Coordination of Care;Education Method;Referrals (12/14/15 1400) Referrals: Genetics (12/14/15 1400) Coordination of Care: Appts (12/14/15 1400)        Acuity: Level 2 (12/14/15 1400)         Time Spent with Patient: 90 (12/14/15 1400)   Met patient and her husband today during her initial medical oncology consult.  Surgical options, radiation therapy and need for genetic testing discussed.  Appointment scheduled to see Dr. Tamala Julian on Thursday at 4:15.  Based on patients age at diagnosis of 25 and  per NCCN guidelines, patient is eligible for genetic testing.  Blood specimen collected and shipped to [facility] via [place].  Patient is to call if she has any questions or needs.  She is to also let me know the date of her surgery and I can schedule her a follow up appointment with Dr. Grayland Ormond.

## 2015-12-16 ENCOUNTER — Ambulatory Visit
Admission: RE | Admit: 2015-12-16 | Discharge: 2015-12-16 | Disposition: A | Discharge status: Home / Self Care | Payer: 59 | Source: Ambulatory Visit | Attending: Internal Medicine | Admitting: Internal Medicine

## 2015-12-16 DIAGNOSIS — R1031 Right lower quadrant pain: Secondary | ICD-10-CM | POA: Diagnosis not present

## 2015-12-16 DIAGNOSIS — R938 Abnormal findings on diagnostic imaging of other specified body structures: Secondary | ICD-10-CM | POA: Diagnosis not present

## 2015-12-16 DIAGNOSIS — Z9889 Other specified postprocedural states: Secondary | ICD-10-CM | POA: Insufficient documentation

## 2015-12-16 DIAGNOSIS — R1032 Left lower quadrant pain: Secondary | ICD-10-CM | POA: Diagnosis present

## 2015-12-28 ENCOUNTER — Other Ambulatory Visit: Payer: Self-pay | Admitting: Surgery

## 2015-12-28 DIAGNOSIS — D0511 Intraductal carcinoma in situ of right breast: Secondary | ICD-10-CM

## 2016-01-12 ENCOUNTER — Other Ambulatory Visit: Payer: 59

## 2016-01-12 ENCOUNTER — Encounter: Payer: Self-pay | Admitting: *Deleted

## 2016-01-12 NOTE — Patient Instructions (Signed)
  Your procedure is scheduled on: 01-20-16 Report to [place] @ 7:30 PER PT   Remember: Instructions that are not followed completely may result in serious medical risk, up to and including death, or upon the discretion of your surgeon and anesthesiologist your surgery may need to be rescheduled.    _x___ 1. Do not eat food or drink liquids after midnight. No gum chewing or hard candies.     __x__ 2. No Alcohol for 24 hours before or after surgery.   __x__3. No Smoking for 24 prior to surgery.   ____  4. Bring all medications with you on the day of surgery if instructed.    __x__ 5. Notify your doctor if there is any change in your medical condition     (cold, fever, infections).     Do not wear jewelry, make-up, hairpins, clips or nail polish.  Do not wear lotions, powders, or perfumes. You may wear deodorant.  Do not shave 48 hours prior to surgery. Men may shave face and neck.  Do not bring valuables to the hospital.    [facility] is not responsible for any belongings or valuables.               Contacts, dentures or bridgework may not be worn into surgery.  Leave your suitcase in the car. After surgery it may be brought to your room.  For patients admitted to the hospital, discharge time is determined by your treatment team.   Patients discharged the day of surgery will not be allowed to drive home.    Please read over the following fact sheets that you were given:   [facility] Preparing for Surgery and or MRSA Information   _x___ Take these medicines the morning of surgery with A SIP OF WATER:    1. NONE  2.  3.  4.  5.  6.  ____ Fleet Enema (as directed)   ____ Use CHG Soap or sage wipes as directed on instruction sheet   ____ Use inhalers on the day of surgery and bring to hospital day of surgery  ____ Stop metformin 2 days prior to surgery    ____ Take 1/2 of usual insulin dose the night before surgery and none on the morning of            surgery.   ____ Stop aspirin or coumadin, or plavix  _x__ Stop Anti-inflammatories such as Advil, Aleve, Ibuprofen, Motrin, Naproxen,          Naprosyn, Goodies powders or aspirin products. Ok to take Tylenol.   ____ Stop supplements until after surgery.    ____ Bring C-Pap to the hospital.

## 2016-01-20 ENCOUNTER — Encounter
Admission: RE | Admit: 2016-01-20 | Discharge: 2016-01-20 | Disposition: A | Discharge status: Home / Self Care | Payer: 59 | Source: Ambulatory Visit | Attending: Surgery | Admitting: Surgery

## 2016-01-20 ENCOUNTER — Inpatient Hospital Stay: Payer: 59 | Admitting: Anesthesiology

## 2016-01-20 ENCOUNTER — Encounter
Admission: RE | Disposition: A | Discharge status: Home / Self Care | Payer: Self-pay | Source: Ambulatory Visit | Attending: Surgery

## 2016-01-20 ENCOUNTER — Ambulatory Visit
Admission: RE | Admit: 2016-01-20 | Discharge: 2016-01-20 | Disposition: A | Discharge status: Home / Self Care | Payer: 59 | Source: Ambulatory Visit | Attending: Surgery | Admitting: Surgery

## 2016-01-20 ENCOUNTER — Encounter: Payer: Self-pay | Admitting: *Deleted

## 2016-01-20 ENCOUNTER — Ambulatory Visit: Payer: 59

## 2016-01-20 DIAGNOSIS — D0581 Other specified type of carcinoma in situ of right breast: Secondary | ICD-10-CM | POA: Diagnosis present

## 2016-01-20 DIAGNOSIS — E119 Type 2 diabetes mellitus without complications: Secondary | ICD-10-CM | POA: Insufficient documentation

## 2016-01-20 DIAGNOSIS — D0511 Intraductal carcinoma in situ of right breast: Secondary | ICD-10-CM

## 2016-01-20 HISTORY — DX: Type 2 diabetes mellitus without complications: E11.9

## 2016-01-20 HISTORY — DX: Malignant (primary) neoplasm, unspecified: C80.1

## 2016-01-20 HISTORY — PX: MASTECTOMY W/ SENTINEL NODE BIOPSY: SHX2001

## 2016-01-20 HISTORY — PX: SENTINEL NODE BIOPSY: SHX6608

## 2016-01-20 LAB — GLUCOSE, CAPILLARY
GLUCOSE-CAPILLARY: 136 mg/dL — AB (ref 65–99)
Glucose-Capillary: 106 mg/dL — ABNORMAL HIGH (ref 65–99)

## 2016-01-20 LAB — POCT PREGNANCY, URINE: Preg Test, Ur: NEGATIVE

## 2016-01-20 SURGERY — MASTECTOMY WITH SENTINEL LYMPH NODE BIOPSY
Anesthesia: General | Laterality: Right | Wound class: Clean Contaminated

## 2016-01-20 MED ORDER — HYDROCODONE-ACETAMINOPHEN 5-325 MG PO TABS: 1.0000 | ORAL_TABLET | ORAL | Status: DC | PRN

## 2016-01-20 MED ORDER — OXYCODONE HCL 5 MG PO TABS
5.0000 mg | ORAL_TABLET | Freq: Once | ORAL | Status: AC | PRN
  Administered 2016-01-20: 5 mg via ORAL

## 2016-01-20 MED ORDER — SODIUM CHLORIDE 0.9 % IV SOLN
INTRAVENOUS | Status: DC
  Administered 2016-01-20 (×2): via INTRAVENOUS

## 2016-01-20 MED ORDER — ONDANSETRON HCL 4 MG/2ML IJ SOLN
INTRAMUSCULAR | Status: DC | PRN
Start: 2016-01-20 — End: 2016-01-20
  Administered 2016-01-20: 4 mg via INTRAVENOUS

## 2016-01-20 MED ORDER — FENTANYL CITRATE (PF) 100 MCG/2ML IJ SOLN
25.0000 ug | INTRAMUSCULAR | Status: DC | PRN
  Administered 2016-01-20 (×2): 25 ug via INTRAVENOUS
  Administered 2016-01-20: 50 ug via INTRAVENOUS

## 2016-01-20 MED ORDER — PROPOFOL 10 MG/ML IV BOLUS
INTRAVENOUS | Status: DC | PRN
  Administered 2016-01-20: 30 mg via INTRAVENOUS
  Administered 2016-01-20: 50 mg via INTRAVENOUS
  Administered 2016-01-20: 130 mg via INTRAVENOUS
  Administered 2016-01-20: 30 mg via INTRAVENOUS

## 2016-01-20 MED ORDER — FAMOTIDINE 20 MG PO TABS
ORAL_TABLET | ORAL | Status: AC
  Filled 2016-01-20: qty 1

## 2016-01-20 MED ORDER — DEXAMETHASONE SODIUM PHOSPHATE 10 MG/ML IJ SOLN
INTRAMUSCULAR | Status: DC | PRN
  Administered 2016-01-20: 10 mg via INTRAVENOUS

## 2016-01-20 MED ORDER — FAMOTIDINE 20 MG PO TABS
20.0000 mg | ORAL_TABLET | Freq: Once | ORAL | Status: AC
Start: 2016-01-20 — End: 2016-01-20
  Administered 2016-01-20: 20 mg via ORAL

## 2016-01-20 MED ORDER — FENTANYL CITRATE (PF) 100 MCG/2ML IJ SOLN
INTRAMUSCULAR | Status: AC
  Administered 2016-01-20: 50 ug via INTRAVENOUS
  Filled 2016-01-20: qty 2

## 2016-01-20 MED ORDER — OXYCODONE HCL 5 MG PO TABS
ORAL_TABLET | ORAL | Status: AC
  Filled 2016-01-20: qty 1

## 2016-01-20 MED ORDER — SUCCINYLCHOLINE CHLORIDE 20 MG/ML IJ SOLN
INTRAMUSCULAR | Status: DC | PRN
  Administered 2016-01-20: 80 mg via INTRAVENOUS

## 2016-01-20 MED ORDER — METHYLENE BLUE 0.5 % INJ SOLN
INTRAVENOUS | Status: AC
  Filled 2016-01-20: qty 10

## 2016-01-20 MED ORDER — LIDOCAINE HCL (CARDIAC) 20 MG/ML IV SOLN
INTRAVENOUS | Status: DC | PRN
  Administered 2016-01-20: 50 mg via INTRAVENOUS

## 2016-01-20 MED ORDER — TECHNETIUM TC 99M SULFUR COLLOID
1.0000 | Freq: Once | INTRAVENOUS | Status: AC | PRN
  Administered 2016-01-20: 0.94 via INTRAVENOUS

## 2016-01-20 MED ORDER — OXYCODONE HCL 5 MG/5ML PO SOLN: 5.0000 mg | Freq: Once | ORAL | Status: AC | PRN

## 2016-01-20 MED ORDER — PHENYLEPHRINE HCL 10 MG/ML IJ SOLN
INTRAMUSCULAR | Status: DC | PRN
  Administered 2016-01-20 (×3): 50 ug via INTRAVENOUS

## 2016-01-20 MED ORDER — FENTANYL CITRATE (PF) 100 MCG/2ML IJ SOLN
INTRAMUSCULAR | Status: DC | PRN
  Administered 2016-01-20 (×2): 25 ug via INTRAVENOUS
  Administered 2016-01-20 (×2): 50 ug via INTRAVENOUS
  Administered 2016-01-20 (×2): 25 ug via INTRAVENOUS
  Administered 2016-01-20: 50 ug via INTRAVENOUS

## 2016-01-20 SURGICAL SUPPLY — 43 items
BULB RESERV EVAC DRAIN JP 100C (MISCELLANEOUS) ×6 IMPLANT
CANISTER SUCT 1200ML W/VALVE (MISCELLANEOUS) ×3 IMPLANT
CHLORAPREP W/TINT 26ML (MISCELLANEOUS) ×3 IMPLANT
CNTNR SPEC 2.5X3XGRAD LEK (MISCELLANEOUS) ×1
CONT SPEC 4OZ STER OR WHT (MISCELLANEOUS) ×2
CONTAINER SPEC 2.5X3XGRAD LEK (MISCELLANEOUS) ×1 IMPLANT
DRAIN CHANNEL JP 15F RND 16 (MISCELLANEOUS) IMPLANT
DRAIN CHANNEL JP 19F (MISCELLANEOUS) ×6 IMPLANT
DRAPE LAPAROTOMY TRNSV 106X77 (MISCELLANEOUS) ×3 IMPLANT
ELECT CAUTERY BLADE 6.4 (BLADE) ×3 IMPLANT
ELECT REM PT RETURN 9FT ADLT (ELECTROSURGICAL) ×3
ELECTRODE REM PT RTRN 9FT ADLT (ELECTROSURGICAL) ×1 IMPLANT
GAUZE SPONGE 4X4 12PLY STRL (GAUZE/BANDAGES/DRESSINGS) ×3 IMPLANT
GLOVE BIO SURGEON STRL SZ7.5 (GLOVE) ×24 IMPLANT
GOWN STRL REUS W/ TWL LRG LVL3 (GOWN DISPOSABLE) ×4 IMPLANT
GOWN STRL REUS W/TWL LRG LVL3 (GOWN DISPOSABLE) ×8
HANDLE YANKAUER SUCT BULB TIP (MISCELLANEOUS) ×3 IMPLANT
KIT RM TURNOVER STRD PROC AR (KITS) ×3 IMPLANT
LABEL OR SOLS (LABEL) IMPLANT
LIQUID BAND (GAUZE/BANDAGES/DRESSINGS) ×3 IMPLANT
NDL SAFETY 18GX1.5 (NEEDLE) IMPLANT
NDL SAFETY 22GX1.5 (NEEDLE) IMPLANT
PACK BASIN MINOR ARMC (MISCELLANEOUS) ×3 IMPLANT
SLEVE PROBE SENORX GAMMA FIND (MISCELLANEOUS) ×3 IMPLANT
SPONGE LAP 18X18 5 PK (GAUZE/BANDAGES/DRESSINGS) ×3 IMPLANT
SUT CHROMIC 3 0 SH 27 (SUTURE) ×3 IMPLANT
SUT CHROMIC 4 0 RB 1X27 (SUTURE) IMPLANT
SUT ETHILON 3-0 FS-10 30 BLK (SUTURE) ×3
SUT ETHILON 4-0 (SUTURE)
SUT ETHILON 4-0 FS2 18XMFL BLK (SUTURE)
SUT MNCRL 4-0 (SUTURE) ×2
SUT MNCRL 4-0 27XMFL (SUTURE) ×1
SUT SILK 3-0 (SUTURE) ×4
SUT SILK 3-0 SH-1 18XCR BRD (SUTURE) ×2
SUT VICRYL+ 3-0 144IN (SUTURE) IMPLANT
SUTURE EHLN 3-0 FS-10 30 BLK (SUTURE) ×1 IMPLANT
SUTURE ETHLN 4-0 FS2 18XMF BLK (SUTURE) IMPLANT
SUTURE MNCRL 4-0 27XMF (SUTURE) ×1 IMPLANT
SUTURE SILK 3-0 SH-1 18XCR BRD (SUTURE) ×2 IMPLANT
SYRINGE 10CC LL (SYRINGE) ×3 IMPLANT
TUBING CONNECTING 10 (TUBING) ×2 IMPLANT
TUBING CONNECTING 10' (TUBING) ×1
WATER STERILE IRR 1000ML POUR (IV SOLUTION) ×3 IMPLANT

## 2016-01-20 NOTE — Anesthesia Postprocedure Evaluation (Signed)
Anesthesia Post Note  Patient: XZANDRIA CLEVINGER  Procedure(s) Performed: Procedure(s) (LRB): MASTECTOMY WITH SENTINEL LYMPH NODE BIOPSY (Right) SENTINEL NODE BIOPSY (Right)  Patient location during evaluation: PACU Anesthesia Type: General Level of consciousness: awake and alert Pain management: pain level controlled Vital Signs Assessment: post-procedure vital signs reviewed and stable Respiratory status: spontaneous breathing, nonlabored ventilation, respiratory function stable and patient connected to nasal cannula oxygen Cardiovascular status: blood pressure returned to baseline and stable Postop Assessment: no signs of nausea or vomiting Anesthetic complications: no    Last Vitals:  Filed Vitals:   01/20/16 1405 01/20/16 1416  BP: 135/80 122/66  Pulse: 93 90  Temp: 37.6 C   Resp: 16 16    Last Pain:  Filed Vitals:   01/20/16 1419  PainSc: 2                  Precious Haws Geni Skorupski

## 2016-01-20 NOTE — Anesthesia Procedure Notes (Addendum)
Procedure Name: LMA Insertion Date/Time: 01/20/2016 9:57 AM Performed by: Jenetta Downer Pre-anesthesia Checklist: Patient identified, Emergency Drugs available, Suction available and Patient being monitored Patient Re-evaluated:Patient Re-evaluated prior to inductionOxygen Delivery Method: Circle system utilized Preoxygenation: Pre-oxygenation with 100% oxygen Intubation Type: IV induction Ventilation: Mask ventilation without difficulty LMA Size: 3.0 Number of attempts: 1 Placement Confirmation: positive ETCO2 and breath sounds checked- equal and bilateral Dental Injury: Teeth and Oropharynx as per pre-operative assessment    Procedure Name: Intubation Date/Time: 01/20/2016 10:54 AM Performed by: Jenetta Downer Oxygen Delivery Method: Circle system utilized Preoxygenation: Pre-oxygenation with 100% oxygen Ventilation: Mask ventilation without difficulty Laryngoscope Size: Miller and 2 Grade View: Grade I Tube type: Oral Tube size: 7.0 mm Number of attempts: 1 Airway Equipment and Method: Stylet Secured at: 21 cm Tube secured with: Tape

## 2016-01-20 NOTE — H&P (Signed)
Madison Johnson is an 40 y.o. female.   Chief Complaint: Breast cancer HPI: She had mammograms on 5-17 demonstrating branching pleomorphic calcifications upper outer quadrant right breast spanning 4 cm. There were similar branching pleomorphic calcifications in the lower outer quadrant right breast spanning 5.5 cm. 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 Mastectomy with sentinel lymph node biopsy possible modified radical mastectomy was recommended for definitive treatment. She has also had plastic surgical consultation to consider the role of reconstruction. Current plan is to do the mastectomy and anticipated reconstruction at a later date.  Past Medical History  Diagnosis Date  . Cancer (HCC)     BREAST  . Diabetes mellitus without complication ([place])     BORDERLINE PER PT-MD TOLD PT TO EXERCISE AND DIET-NO MEDS CURRENTLY    Past Surgical History  Procedure Laterality Date  . Breast biopsy Right 12/02/2015    path pending, stereo x2 areas  . Breast biopsy Left 12/02/2015    [place] bx path pending  . Tubal ligation    . Eye surgery      GROWTH REMOVED    History reviewed. No pertinent family history. Social History:  reports that she has never smoked. She does not have any smokeless tobacco history on file. She reports that she drinks alcohol. She reports that she does not use illicit drugs.  Allergies: No Known Allergies  Medications Prior to Admission  Medication Sig Dispense Refill  . ibuprofen (ADVIL,MOTRIN) 200 MG tablet Take 200 mg by mouth every 6 (six) hours as needed.      Results for orders placed or performed during the hospital encounter of 01/20/16 (from the past 48 hour(s))  Glucose, capillary     Status: Abnormal   Collection Time: 01/20/16  8:14 AM  Result Value Ref Range   Glucose-Capillary 106 (H) 65 - 99 mg/dL   Nm Sentinel Node Inj-no Rpt (breast)  01/20/2016  CLINICAL DATA: DCIS (ductal  carcinoma in situ), right Sulfur colloid was injected intradermally by the nuclear medicine technologist for breast cancer sentinel node localization.    ROS: She reports no change in overall condition since the office examination. She reports no recent acute illness since the office visit. Review of systems otherwise negative.  Blood pressure 129/83, pulse 75, temperature 98.1 F (36.7 C), temperature source Oral, resp. rate 16, height 5' (1.524 m), weight 73.936 kg (163 lb), SpO2 97 %. Physical Exam  GENERAL:  Awake alert and oriented and in no acute distress.  HEENT:  Head is normocephalic.  Pupils are equal reactive to light.  Extraocular movements are intact. Sclera is clear.  Pharynx is clear.  HEART:  Regular rhythm S1-S2, without murmur.  LUNGS:  Clear without rales rhonchi or wheezes.  BREAST: There is a YES mark on the right breast.  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 I discussed the perioperative care and a plan for follow-up.  Rochel Brome, MD 01/20/2016, 9:38 AM

## 2016-01-20 NOTE — Op Note (Signed)
OPERATIVE REPORT  PREOPERATIVE  DIAGNOSIS: . Ductal carcinoma in situ of right breast.  POSTOPERATIVE DIAGNOSIS: . Ductal carcinoma in situ right breast.  PROCEDURE: . Right mastectomy with axillary sentinel lymph node biopsy.  ANESTHESIA:  General  SURGEON: Rochel Brome  MD   INDICATIONS: . She had recent mammogram findings of microcalcifications in 2 large areas of the lateral aspect of the right breast. Stereotactic needle biopsy at 2 sites demonstrated ductal carcinoma in situ.  She did have preoperative injection of radioactive technetium sulfur colloid. The patient was placed on the operating table in the supine position under general anesthesia. The right arm was placed on a lateral arm support. The right breast and surrounding chest wall were prepared with ChloraPrep and draped in a sterile manner.  A curvilinear incision was made from inferior medial to superior lateral above and below the breast. Silk sutures were used for traction of skin flaps. Dissection was carried out in the subcutaneous plane in the direction of the clavicle, medially towards the sternum, inferiorly to the inframammary fold, and laterally to the latissimus dorsi muscle. During the course of dissection of a number of clips bleeding points were suture ligated with 3-0 chromic. The axilla was probed with a gamma counter demonstrating location of radioactivity. The breast was elevated off the underlying deep fascia with electrocautery dissecting from medial to lateral.  Dissection was carried out in the axilla exposing the sentinel lymph node. The lymph node found was approximately 5 mm in dimension. The ex vivo counts per second or in the range of 180-200. This was submitted for sentinel lymph node frozen section. One other lymph node was encountered which was removed which was not radioactive and was submitted separately. There was no significant remaining radioactivity. There was no palpable mass within the axilla.  The pathologist called to report that the sentinel lymph node was fat replaced and was negative for cancer. The simple mastectomy was completed with excision of the axillary tail. The lateral end of the skin ellipse was tagged with a stitch for the pathologist orientation. The breast was submitted fresh for pathology.  The wound was inspected and several tiny bleeding points were cauterized. The wound was irrigated with warm saline solution and aspirated. Further inspection was carried out methodically and determined hemostasis was intact. 2 Blake drains were placed through separate inferior stab wounds 1 along the anterior chest wall and one extended up into the axilla. The drains were secured to the skin with 3-0 nylon suture. Further seeing hemostasis was intact the wound was closed with running 4-0 Monocryl subcuticular suture and LiquiBand. The drains were activated and had minimal serosanguineous drainage. Dressings were applied around the drain sites using 4 x 4 folded cotton gauze and benzoin and 3 inch silk tape. Estimated blood loss 30 cc.  The patient appeared to be in satisfactory condition and was prepared for transfer to the recovery room.  Rochel Brome M.D.

## 2016-01-20 NOTE — Anesthesia Preprocedure Evaluation (Signed)
Anesthesia Evaluation  Patient identified by MRN, date of birth, ID band Patient awake    Reviewed: Allergy & Precautions, H&P , NPO status , Patient's Chart, lab work & pertinent test results  History of Anesthesia Complications Negative for: history of anesthetic complications  Airway Mallampati: III  TM Distance: <3 FB Neck ROM: full    Dental  (+) Poor Dentition   Pulmonary neg pulmonary ROS, neg shortness of breath,    Pulmonary exam normal breath sounds clear to auscultation       Cardiovascular Exercise Tolerance: Good (-) angina(-) Past MI and (-) DOE negative cardio ROS Normal cardiovascular exam Rhythm:regular Rate:Normal     Neuro/Psych negative neurological ROS  negative psych ROS   GI/Hepatic negative GI ROS, Neg liver ROS, neg GERD  ,  Endo/Other  diabetes, Type 2  Renal/GU negative Renal ROS  negative genitourinary   Musculoskeletal   Abdominal   Peds  Hematology negative hematology ROS (+)   Anesthesia Other Findings Past Medical History:   Cancer ([place])                                                   Comment:BREAST   Diabetes mellitus without complication ([place])                   Comment:BORDERLINE PER PT-MD TOLD PT TO EXERCISE AND               DIET-NO MEDS CURRENTLY  Past Surgical History:   BREAST BIOPSY                                   Right 12/02/2015      Comment:path pending, stereo x2 areas   BREAST BIOPSY                                   Left 12/02/2015      Comment:[place] bx path pending   TUBAL LIGATION                                                EYE SURGERY                                                     Comment:GROWTH REMOVED  BMI    Body Mass Index   31.83 kg/m 2      Reproductive/Obstetrics negative OB ROS                             Anesthesia Physical Anesthesia Plan  ASA: III  Anesthesia Plan: General LMA   Post-op Pain Management:     Induction:   Airway Management Planned:   Additional Equipment:   Intra-op Plan:   Post-operative Plan:   Informed Consent: I have reviewed the patients History and Physical, chart, labs and discussed the procedure including the risks, benefits and  alternatives for the proposed anesthesia with the patient or authorized representative who has indicated his/her understanding and acceptance.   Dental Advisory Given  Plan Discussed with: Anesthesiologist, CRNA and Surgeon  Anesthesia Plan Comments:         Anesthesia Quick Evaluation

## 2016-01-20 NOTE — OR Nursing (Signed)
Transfer of care : pt c/o low blood sugar sx include sweating nausea. CBG checked at 136 fluids on arrival LR.

## 2016-01-20 NOTE — Discharge Instructions (Addendum)
Take Tylenol or Norco if needed for pain.  Empty drains 2 times per day and as needed and record drainage for drain #1 and drain #2.  Call office next week to report the amount of drainage and schedule a follow-up visit.  Gradually increase walking. Avoid vigorous activities with the right arm.  May remove support stockings on Friday.  Keep dressing dry.  Bulb Drain Home Care A bulb drain consists of a thin rubber tube and a soft, round bulb that creates a gentle suction. The rubber tube is placed in the area where you had surgery. A bulb is attached to the end of the tube that is outside the body. The bulb drain removes excess fluid that normally builds up in a surgical wound after surgery. The color and amount of fluid will vary. Immediately after surgery, the fluid is bright red and is a little thicker than water. It may gradually change to a yellow or pink color and become more thin and water-like. When the amount decreases to about 1 or 2 tbsp in 24 hours, your health care provider will usually remove it. DAILY CARE  Keep the bulb flat (compressed) at all times, except while emptying it. The flatness creates suction. You can flatten the bulb by squeezing it firmly in the middle and then closing the cap.  Keep sites where the tube enters the skin dry and covered with a bandage (dressing).  Secure the tube 1-2 in (2.5-5.1 cm) below the insertion sites to keep it from pulling on your stitches. The tube is stitched in place and will not slip out.  Secure the bulb as directed by your health care provider.  For the first 3 days after surgery, there usually is more fluid in the bulb. Empty the bulb whenever it becomes half full because the bulb does not create enough suction if it is too full. The bulb could also overflow. Write down how much fluid you remove each time you empty your drain. Add up the amount removed in 24 hours.  Empty the bulb at the same time every day once the amount of  fluid decreases and you only need to empty it once a day. Write down the amounts and the 24-hour totals to give to your health care provider. This helps your health care provider know when the tubes can be removed. EMPTYING THE BULB DRAIN Before emptying the bulb, get a measuring cup, a piece of paper and a pen, and wash your hands.  Gently run your fingers down the tube (stripping) to empty any drainage from the tubing into the bulb. This may need to be done several times a day to clear the tubing of clots and tissue.  Open the bulb cap to release suction, which causes it to inflate. Do not touch the inside of the cap.  Gently run your fingers down the tube (stripping) to empty any drainage from the tubing into the bulb.  Hold the cap out of the way, and pour fluid into the measuring cup.   Squeeze the bulb to provide suction.  Replace the cap.   Check the tape that holds the tube to your skin. If it is becoming loose, you can remove the loose piece of tape and apply a new one. Then, pin the bulb to your shirt.   Write down the amount of fluid you emptied out. Write down the date and each time you emptied your bulb drain. (If there are 2 bulbs, note the amount of  drainage from each bulb and keep the totals separate. Your health care provider will want to know the total amounts for each drain and which tube is draining more.)   Flush the fluid down the toilet and wash your hands.   Call your health care provider once you have less than 2 tbsp of fluid collecting in the bulb drain every 24 hours. If there is drainage around the tube site, change dressings and keep the area dry. Cleanse around tube with sterile saline and place dry gauze around site. This gauze should be changed when it is soiled. If it stays clean and unsoiled, it should still be changed daily.  SEEK MEDICAL CARE IF:  Your drainage has a bad smell or is cloudy.   You have a fever.   Your drainage is increasing  instead of decreasing.   Your tube fell out.   You have redness or swelling around the tube site.   You have drainage from a surgical wound.   Your bulb drain will not stay flat after you empty it.  MAKE SURE YOU:   Understand these instructions.  Will watch your condition.  Will get help right away if you are not doing well or get worse.   This information is not intended to replace advice given to you by your health care provider. Make sure you discuss any questions you have with your health care provider.   Document Released: 07/07/2000 Document Revised: 07/31/2014 Document Reviewed: 01/27/2015 Elsevier Interactive Patient Education 2016 [place]   1) The drugs that you were given will stay in your system until tomorrow so for the next 24 hours you should not:  A) Drive an automobile B) Make any legal decisions C) Drink any alcoholic beverage  2) You may resume regular meals tomorrow.  Today it is better to start with liquids and gradually work up to solid foods.  You may eat anything you prefer, but it is better to start with liquids, then soup and crackers, and gradually work up to solid foods.  3) Please notify your doctor immediately if you have any unusual bleeding, trouble breathing, redness and pain at the surgery site, drainage, fever, or pain not relieved by medication.  4) Additional Instructions: TAKE A STOOL SOFTENER TWICE A DAY WHILE TAKING NARCOTIC PAIN MEDICINE TO PREVENT CONSTIPATION  Please contact your physician with any problems or Same Day Surgery at [phone], Monday through Friday 6 am to 4 pm, or [place] at [facility] number at [phone].

## 2016-01-20 NOTE — Transfer of Care (Signed)
Immediate Anesthesia Transfer of Care Note  Patient: Madison Johnson  Procedure(s) Performed: Procedure(s): MASTECTOMY WITH SENTINEL LYMPH NODE BIOPSY (Right) SENTINEL NODE BIOPSY (Right)  Patient Location: PACU  Anesthesia Type:General  Level of Consciousness: awake, alert  and oriented  Airway & Oxygen Therapy: Patient Spontanous Breathing and Patient connected to nasal cannula oxygen  Post-op Assessment: Report given to RN and Post -op Vital signs reviewed and stable  Post vital signs: Reviewed and stable  Last Vitals:  Filed Vitals:   01/20/16 0811 01/20/16 1257  BP: 129/83   Pulse: 75   Temp: 36.7 C 36.8 C  Resp: 16     Last Pain: There were no vitals filed for this visit.       Complications: No apparent anesthesia complications

## 2016-01-26 LAB — SURGICAL PATHOLOGY

## 2016-02-02 ENCOUNTER — Inpatient Hospital Stay: Payer: 59 | Attending: Oncology | Admitting: Oncology

## 2016-02-02 VITALS — BP 131/89 | HR 65 | Temp 96.3°F | Resp 18 | Wt 157.7 lb

## 2016-02-02 DIAGNOSIS — Z79810 Long term (current) use of selective estrogen receptor modulators (SERMs): Secondary | ICD-10-CM

## 2016-02-02 DIAGNOSIS — Z170 Estrogen receptor positive status [ER+]: Secondary | ICD-10-CM | POA: Diagnosis not present

## 2016-02-02 DIAGNOSIS — Z9013 Acquired absence of bilateral breasts and nipples: Secondary | ICD-10-CM | POA: Diagnosis not present

## 2016-02-02 DIAGNOSIS — D0511 Intraductal carcinoma in situ of right breast: Secondary | ICD-10-CM | POA: Insufficient documentation

## 2016-02-02 MED ORDER — TAMOXIFEN CITRATE 20 MG PO TABS: 20.0000 mg | ORAL_TABLET | Freq: Every day | ORAL | Status: DC

## 2016-02-02 NOTE — Progress Notes (Signed)
Had right mastectomy. Surgery went well. Having mild soreness at mastectomy site. Drains were removed last Monday.

## 2016-02-02 NOTE — Progress Notes (Signed)
[place]  [phone] Fax:(336) [phone]  ID: Madison Johnson OB: 06/02/1976  MR#: [id number]  NOM#:[id number]  Patient Care Team: No Pcp Per Patient as PCP - General (General Practice)  CHIEF COMPLAINT: Multifocal right breast DCIS  INTERVAL HISTORY: Patient returns to clinic today to discuss her final pathology results and initiation of letrozole.  She continues to have mild tenderness at her surgical site, but otherwise feels well. She has no neurologic complaints. She denies any recent fevers or illnesses. She has a good appetite and denies any weight loss. She denies any pain. She denies any chest pain or shortness of breath. She denies any nausea, vomiting, constipation, or diarrhea. She has no urinary complaints. Patient offers no further specific complaints today.  REVIEW OF SYSTEMS:   Review of Systems  Constitutional: Negative.  Negative for fever, weight loss and malaise/fatigue.  Respiratory: Negative.  Negative for shortness of breath.   Cardiovascular: Negative.  Negative for chest pain.  Gastrointestinal: Negative.  Negative for abdominal pain.  Genitourinary: Negative.   Musculoskeletal: Negative.   Neurological: Negative.  Negative for weakness.  Psychiatric/Behavioral: Negative.     As per HPI. Otherwise, a complete review of systems is negatve.  PAST MEDICAL HISTORY: Past Medical History  Diagnosis Date  . Cancer (HCC)     BREAST  . Diabetes mellitus without complication ([place])     BORDERLINE PER PT-MD TOLD PT TO EXERCISE AND DIET-NO MEDS CURRENTLY    PAST SURGICAL HISTORY: Past Surgical History  Procedure Laterality Date  . Breast biopsy Right 12/02/2015    path pending, stereo x2 areas  . Breast biopsy Left 12/02/2015    [place] bx path pending  . Tubal ligation    . Eye surgery      GROWTH REMOVED  . Mastectomy w/ sentinel node biopsy Right 01/20/2016    Procedure: MASTECTOMY WITH SENTINEL LYMPH NODE BIOPSY;  Surgeon: Leonie Green, MD;  Location: [facility] ORS;  Service: General;  Laterality: Right;  . Sentinel node biopsy Right 01/20/2016    Procedure: SENTINEL NODE BIOPSY;  Surgeon: Leonie Green, MD;  Location: [facility] ORS;  Service: General;  Laterality: Right;    FAMILY HISTORY:Grandfather and maternal aunt with stomach cancer.     ADVANCED DIRECTIVES:    HEALTH MAINTENANCE: Social History  Substance Use Topics  . Smoking status: Never Smoker   . Smokeless tobacco: Not on file  . Alcohol Use: Yes     Comment: OCC     Colonoscopy:  PAP:  Bone density:  Lipid panel:  No Known Allergies  Current Outpatient Prescriptions  Medication Sig Dispense Refill  . HYDROcodone-acetaminophen (NORCO) 5-325 MG tablet Take 1-2 tablets by mouth every 4 (four) hours as needed for moderate pain. 16 tablet 0  . ibuprofen (ADVIL,MOTRIN) 200 MG tablet Take 200 mg by mouth every 6 (six) hours as needed.    . tamoxifen (NOLVADEX) 20 MG tablet Take 1 tablet (20 mg total) by mouth daily. 30 tablet 6   No current facility-administered medications for this visit.    OBJECTIVE: Filed Vitals:   02/02/16 1002  BP: 131/89  Pulse: 65  Temp: 96.3 F (35.7 C)  Resp: 18     Body mass index is 30.81 kg/(m^2).    ECOG FS:0 - Asymptomatic  General: Well-developed, well-nourished, no acute distress. Eyes: Pink conjunctiva, anicteric sclera. Breasts: Well healing right mastectomy scar. Lungs: Clear to auscultation bilaterally. Heart: Regular rate and rhythm. No rubs, murmurs, or gallops. Abdomen:  Soft, nontender, nondistended. No organomegaly noted, normoactive bowel sounds. Musculoskeletal: No edema, cyanosis, or clubbing. Neuro: Alert, answering all questions appropriately. Cranial nerves grossly intact. Skin: No rashes or petechiae noted. Psych: Normal affect.  LAB RESULTS:  No results found for: NA, K, CL, CO2, GLUCOSE, BUN, CREATININE, CALCIUM, PROT, ALBUMIN, AST, ALT, ALKPHOS, BILITOT, GFRNONAA,  GFRAA  No results found for: WBC, NEUTROABS, HGB, HCT, MCV, PLT   STUDIES: Nm Sentinel Node Inj-no Rpt (breast)  01/20/2016  CLINICAL DATA: DCIS (ductal carcinoma in situ), right Sulfur colloid was injected intradermally by the nuclear medicine technologist for breast cancer sentinel node localization.    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  PLAN:    1. Multifocal right breast DCIS: Final pathology did not reveal an invasive component, therefore patient does not require chemotherapy.  Because she had a total mastectomy, she does not require adjuvant XRT. Patient states she has an appointment with a Plastic surgeon in the near future. Patient was initiated on tamoxifen today which she will take for 5 years completing in July 2022.  Return to clinic in 3 months for routine evaluation.  2. Genetic testing: Patient is BCRA 1 and 2 negative.  Approximately 30 minutes was spent in discussion of which 50% was consultation.  Patient expressed understanding and was in agreement with this plan. She also understands that She can call clinic at any time with any questions, concerns, or complaints.    Lloyd Huger, MD   02/03/2016 8:31 PM

## 2016-02-08 NOTE — Progress Notes (Signed)
  Oncology Nurse Navigator Documentation  Navigator Location: [facility] (02/08/16 1200) Navigator Encounter Type: Telephone (02/08/16 1200)               Barriers/Navigation Needs: Financial (rent) (02/08/16 1200)   Interventions: Other (rent) (02/08/16 1200)            Acuity: Level 1 (02/08/16 1200) Acuity Level 1: Minimal follow up required (02/08/16 1200)       Time Spent with Patient: 30 (02/08/16 1200)  Message received from [facility] stating patient needs help with rent.  Noted that we paid rent last month.   Offered assistance with July rent.  Per agency patient came in yesterday, and was able to pay.

## 2016-05-03 NOTE — Progress Notes (Signed)
[place]  [phone] [phone]) [phone]  ID: MIOSHA BEHE OB: 1975-12-31  MR#: [id number]  QIW#:[id number]  Patient Care Team: No Pcp Per Patient as PCP - General (General Practice)  CHIEF COMPLAINT: Multifocal right breast DCIS  INTERVAL HISTORY: Patient returns to clinic today for routine 3 month evaluation. She has occasional dizziness and nausea that appears to be positional in nature. She also has multiple other complaints which include diarrhea and hot flashes that she attributes to tamoxifen. She has no other neurologic complaints. She denies any recent fevers or illnesses. She has a good appetite and denies any weight loss. She denies any pain. She denies any chest pain or shortness of breath. She has no urinary complaints. Patient offers no further specific complaints today.  REVIEW OF SYSTEMS:   Review of Systems  Constitutional: Positive for malaise/fatigue. Negative for fever and weight loss.  Respiratory: Negative.  Negative for cough and shortness of breath.   Cardiovascular: Negative.  Negative for chest pain and leg swelling.  Gastrointestinal: Positive for nausea. Negative for abdominal pain.  Genitourinary: Negative.   Musculoskeletal: Negative.   Neurological: Positive for dizziness, sensory change, weakness and headaches.  Psychiatric/Behavioral: Negative.  The patient is not nervous/anxious.     As per HPI. Otherwise, a complete review of systems is negative.  PAST MEDICAL HISTORY: Past Medical History:  Diagnosis Date  . Cancer (HCC)    BREAST  . Diabetes mellitus without complication ([place])    BORDERLINE PER PT-MD TOLD PT TO EXERCISE AND DIET-NO MEDS CURRENTLY    PAST SURGICAL HISTORY: Past Surgical History:  Procedure Laterality Date  . BREAST BIOPSY Right 12/02/2015   path pending, stereo x2 areas  . BREAST BIOPSY Left 12/02/2015   [place] bx path pending  . EYE SURGERY     GROWTH REMOVED  . MASTECTOMY W/ SENTINEL NODE  BIOPSY Right 01/20/2016   Procedure: MASTECTOMY WITH SENTINEL LYMPH NODE BIOPSY;  Surgeon: Leonie Green, MD;  Location: [facility];  Service: General;  Laterality: Right;  . SENTINEL NODE BIOPSY Right 01/20/2016   Procedure: SENTINEL NODE BIOPSY;  Surgeon: Leonie Green, MD;  Location: [facility];  Service: General;  Laterality: Right;  . TUBAL LIGATION      FAMILY HISTORY: Grandfather and maternal aunt with stomach cancer.     ADVANCED DIRECTIVES:    HEALTH MAINTENANCE: Social History  Substance Use Topics  . Smoking status: Never Smoker  . Smokeless tobacco: Not on file  . Alcohol use Yes     Comment: OCC     Colonoscopy:  PAP:  Bone density:  Lipid panel:  No Known Allergies  Current Outpatient Prescriptions  Medication Sig Dispense Refill  . ibuprofen (ADVIL,MOTRIN) 200 MG tablet Take 200 mg by mouth every 6 (six) hours as needed.    . tamoxifen (NOLVADEX) 20 MG tablet Take 1 tablet (20 mg total) by mouth daily. 30 tablet 6   No current facility-administered medications for this visit.     OBJECTIVE: Vitals:   05/04/16 1457  BP: 121/84  Pulse: 84  Resp: 18  Temp: 98 F (36.7 C)     Body mass index is 31.09 kg/m.    ECOG FS:0 - Asymptomatic  General: Well-developed, well-nourished, no acute distress. Eyes: Pink conjunctiva, anicteric sclera. Breasts: Well healing right mastectomy scar. Lungs: Clear to auscultation bilaterally. Heart: Regular rate and rhythm. No rubs, murmurs, or gallops. Abdomen: Soft, nontender, nondistended. No organomegaly noted, normoactive bowel sounds. Musculoskeletal: No edema, cyanosis,  or clubbing. Neuro: Alert, answering all questions appropriately. Cranial nerves grossly intact. Skin: No rashes or petechiae noted. Psych: Normal affect.  LAB RESULTS:  No results found for: NA, K, CL, CO2, GLUCOSE, BUN, CREATININE, CALCIUM, PROT, ALBUMIN, AST, ALT, ALKPHOS, BILITOT, GFRNONAA, GFRAA  No results found for: WBC,  NEUTROABS, HGB, HCT, MCV, PLT   STUDIES: No results found.  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  PLAN:    1. Multifocal right breast DCIS: Final pathology did not reveal an invasive component, therefore patient does not require chemotherapy.  Because she had a total mastectomy, she does not require adjuvant XRT. Patient states she has an appointment with a Plastic surgeon in the near future. It is unclear if patient's symptoms are related to tamoxifen, therefore she has been instructed to discontinue treatment for several weeks to see if they resolve. If she is improved, she can be switched to an aromatase inhibitor if necessary. Continue hormonal treatment for total 5 years completing in July 2022.  Return to clinic in 3 months for routine evaluation.  2. Genetic testing: Patient is BCRA 1 and 2 negative. 3. Dizziness: Appears positional possibly related to hypotension. Monitor.  Approximately 30 minutes was spent in discussion of which 50% was consultation.  Patient expressed understanding and was in agreement with this plan. She also understands that She can call clinic at any time with any questions, concerns, or complaints.    Lloyd Huger, MD   05/08/2016 6:33 PM

## 2016-05-04 ENCOUNTER — Encounter (INDEPENDENT_AMBULATORY_CARE_PROVIDER_SITE_OTHER): Payer: Self-pay

## 2016-05-04 ENCOUNTER — Inpatient Hospital Stay: Payer: 59 | Attending: Oncology | Admitting: Oncology

## 2016-05-04 VITALS — BP 121/84 | HR 84 | Temp 98.0°F | Resp 18 | Wt 159.2 lb

## 2016-05-04 DIAGNOSIS — R531 Weakness: Secondary | ICD-10-CM | POA: Insufficient documentation

## 2016-05-04 DIAGNOSIS — R110 Nausea: Secondary | ICD-10-CM

## 2016-05-04 DIAGNOSIS — R5383 Other fatigue: Secondary | ICD-10-CM | POA: Insufficient documentation

## 2016-05-04 DIAGNOSIS — R42 Dizziness and giddiness: Secondary | ICD-10-CM | POA: Insufficient documentation

## 2016-05-04 DIAGNOSIS — Z170 Estrogen receptor positive status [ER+]: Secondary | ICD-10-CM | POA: Insufficient documentation

## 2016-05-04 DIAGNOSIS — D0511 Intraductal carcinoma in situ of right breast: Secondary | ICD-10-CM | POA: Diagnosis not present

## 2016-05-04 DIAGNOSIS — Z9011 Acquired absence of right breast and nipple: Secondary | ICD-10-CM | POA: Diagnosis not present

## 2016-05-04 DIAGNOSIS — Z79810 Long term (current) use of selective estrogen receptor modulators (SERMs): Secondary | ICD-10-CM | POA: Insufficient documentation

## 2016-05-04 NOTE — Progress Notes (Signed)
Patient has nausea and dizziness that comes on and she feels like she is going to faint.  States she sits down and lets it past.  Taking her tamoxifen but experiencing nausea, hot flashes, headaches, lightheadedness, diarrhea, constipation and mood swings.  States she does not feel 100%.  Just kind of "blah".

## 2016-06-06 ENCOUNTER — Telehealth: Payer: Self-pay | Admitting: *Deleted

## 2016-06-06 MED ORDER — LETROZOLE 2.5 MG PO TABS: 2.5000 mg | ORAL_TABLET | Freq: Every day | ORAL | 0 refills | Status: DC

## 2016-06-06 NOTE — Telephone Encounter (Signed)
Informed patient that Letrozole was e scribed per VO Dr Grayland Ormond

## 2016-06-06 NOTE — Telephone Encounter (Signed)
States she has been off the Tamoxifen and is now feeling great. Asking that something different be ordered as discussed and send a 90 day supply to [facility] on [address] [facility]

## 2016-06-28 ENCOUNTER — Other Ambulatory Visit: Payer: Self-pay | Admitting: Internal Medicine

## 2016-06-28 DIAGNOSIS — R740 Nonspecific elevation of levels of transaminase and lactic acid dehydrogenase [LDH]: Principal | ICD-10-CM

## 2016-06-28 DIAGNOSIS — R7401 Elevation of levels of liver transaminase levels: Secondary | ICD-10-CM

## 2016-07-07 ENCOUNTER — Ambulatory Visit
Admission: RE | Admit: 2016-07-07 | Discharge: 2016-07-07 | Disposition: A | Discharge status: Home / Self Care | Payer: 59 | Source: Ambulatory Visit | Attending: Internal Medicine | Admitting: Internal Medicine

## 2016-07-07 DIAGNOSIS — K760 Fatty (change of) liver, not elsewhere classified: Secondary | ICD-10-CM | POA: Insufficient documentation

## 2016-07-07 DIAGNOSIS — R740 Nonspecific elevation of levels of transaminase and lactic acid dehydrogenase [LDH]: Secondary | ICD-10-CM | POA: Diagnosis present

## 2016-07-07 DIAGNOSIS — R7401 Elevation of levels of liver transaminase levels: Secondary | ICD-10-CM

## 2016-07-07 DIAGNOSIS — K8020 Calculus of gallbladder without cholecystitis without obstruction: Secondary | ICD-10-CM | POA: Diagnosis not present

## 2016-07-24 DIAGNOSIS — E119 Type 2 diabetes mellitus without complications: Secondary | ICD-10-CM

## 2016-07-24 HISTORY — DX: Type 2 diabetes mellitus without complications: E11.9

## 2016-08-05 IMAGING — MG US BREAST BX W LOC DEV 1ST LESION IMG BX SPEC US GUIDE*L*
1 series · 8 of 8 positions shown · non-contrast
Comparison: Previous exam(s).

ADDENDUM:
Pathology of the left breast ultrasound guided biopsy revealed
BREAST MASS, LEFT [DATE] AND 7 CM FN; BIOPSY: NODULAR FIBROCYSTIC
CHANGE. NEGATIVE FOR ATYPIA AND MALIGNANCY. This was found to be
concordant by Dr. Heras impression and notes and confirmed by Dr.
Baumgartner.

Recommendations: Recommend a six month follow-up left breast
ultrasound.
The patient was contacted by phone by Caamano, Seymour on 12/07/15
for a post biopsy check. She stated she did well following the
biopsy with no bleeding, bruising, or hematoma. All of her questions
were answered. She stated results had been relayed to her by her
physician, Dr. Hankyu on [REDACTED], 12/03/15. She has an appointment with
Dr. Annisa at [HOSPITAL] [HOSPITAL] on [REDACTED], 12/14/15
at [DATE] for consultation regarding the right breast pathology.
Addendum by Caamano, Seymour on 12/07/15.
CLINICAL DATA: Indeterminate left breast mass at [DATE], 7 cm from
the nipple.
EXAM:
ULTRASOUND GUIDED LEFT BREAST CORE NEEDLE BIOPSY

[Series 1: MG view · 0.06mm/px · 8 of 16 slices shown]
[im 1/16]
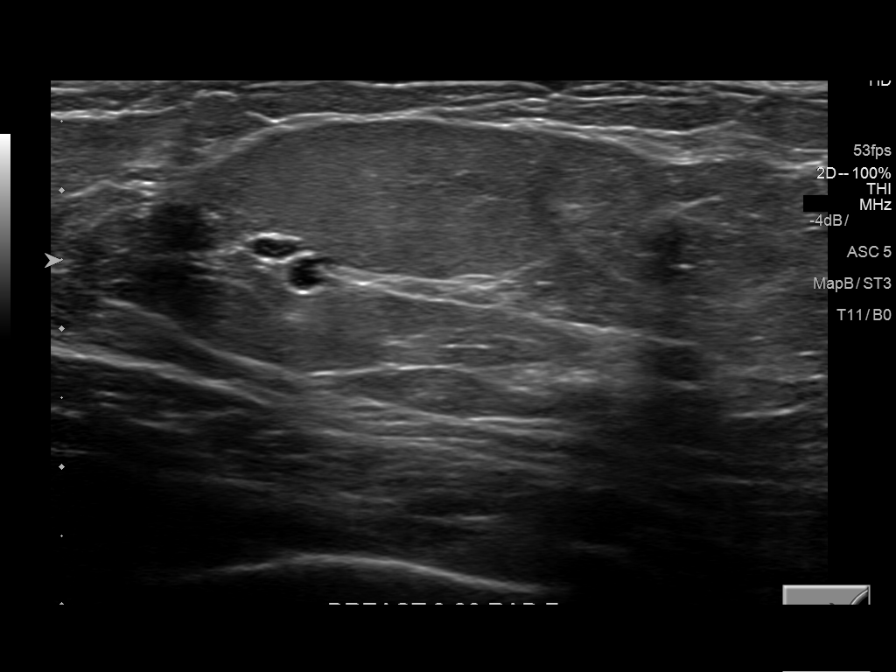
[im 3/16]
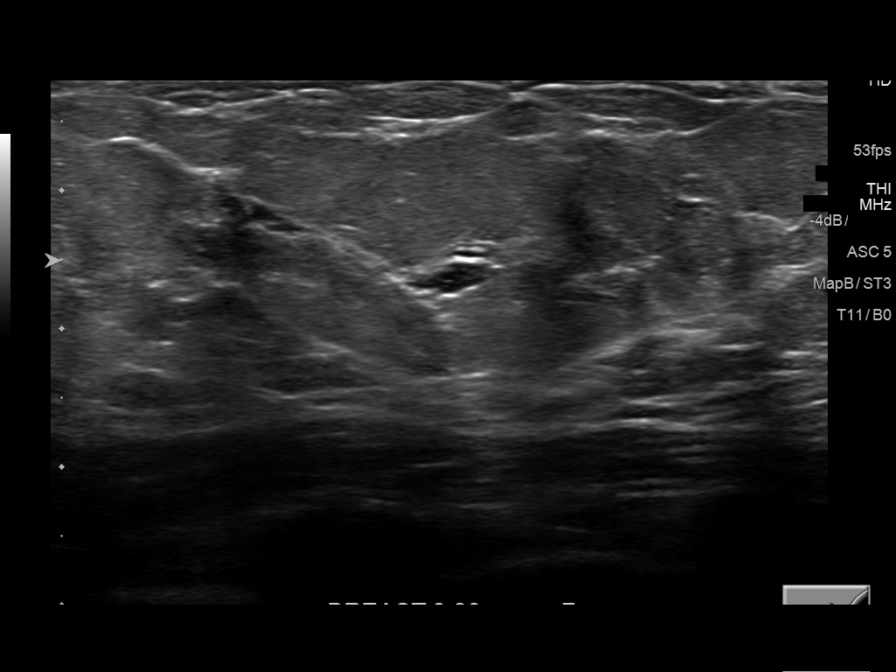
[im 5/16]
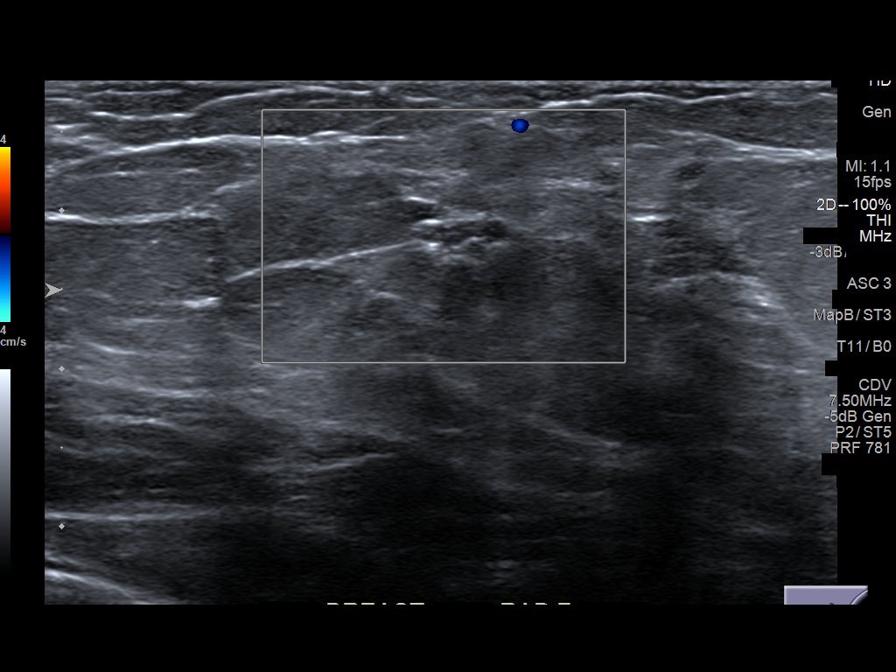
[im 7/16]
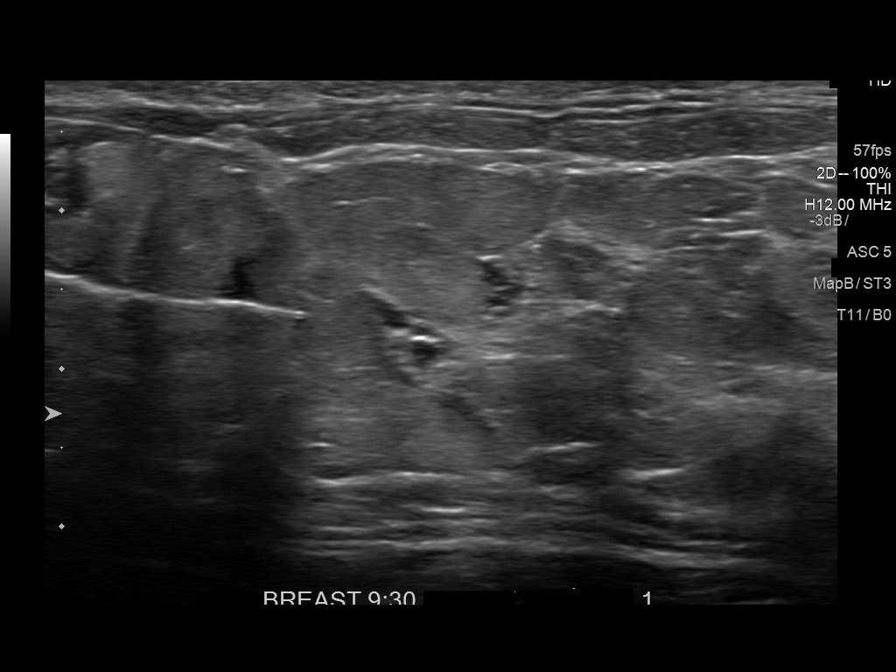
[im 9/16]
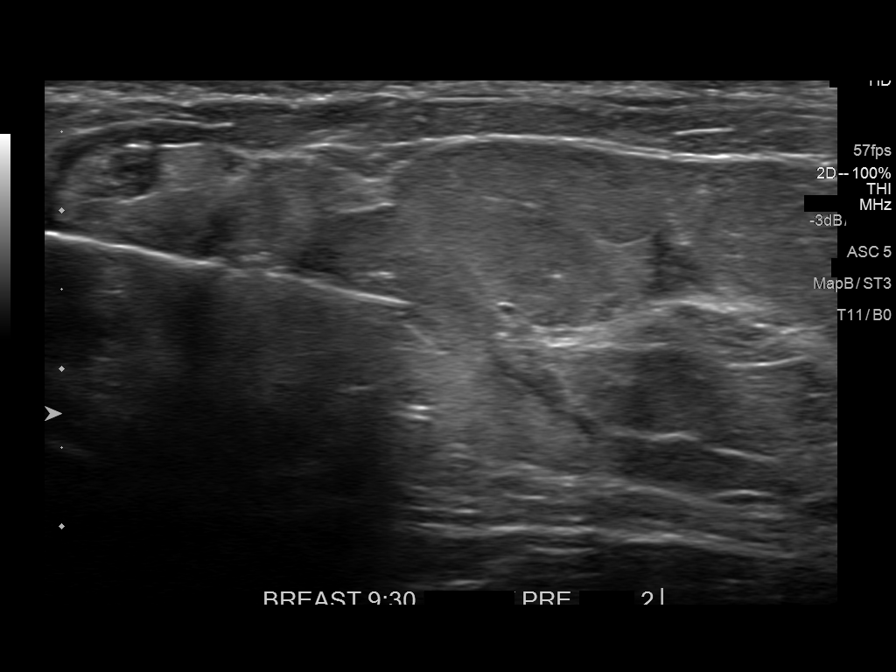
[im 11/16]
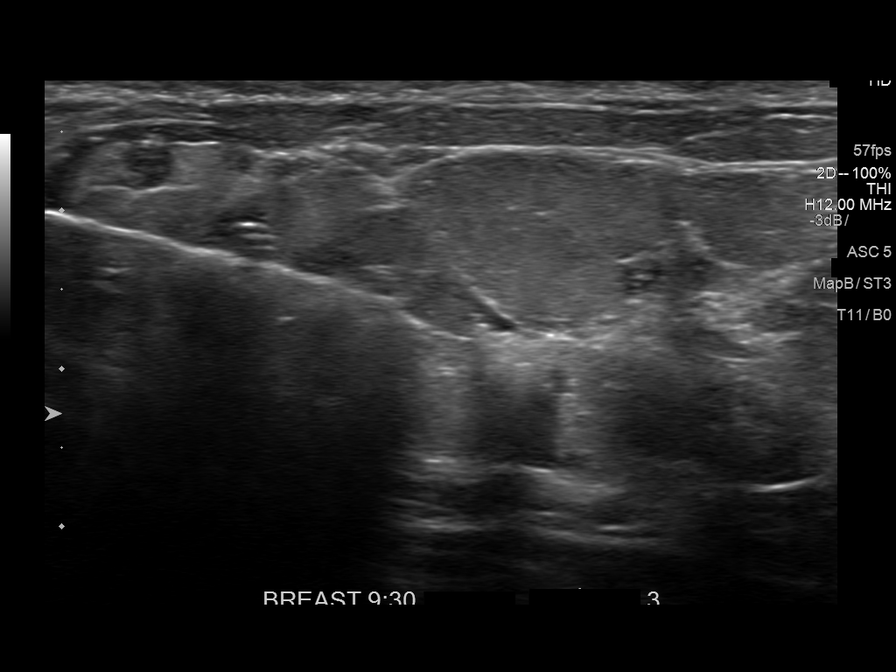
[im 13/16]
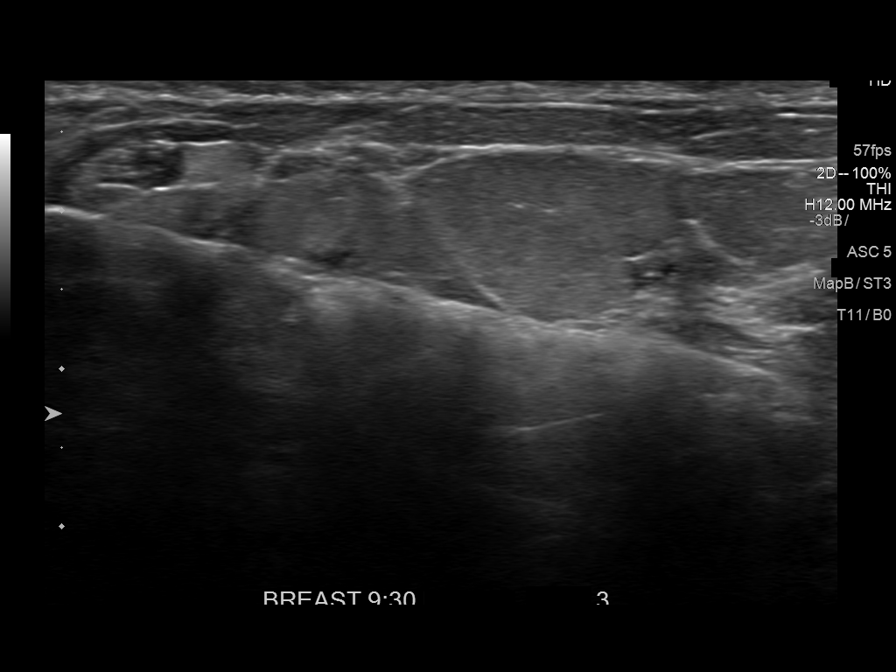
[im 16/16]
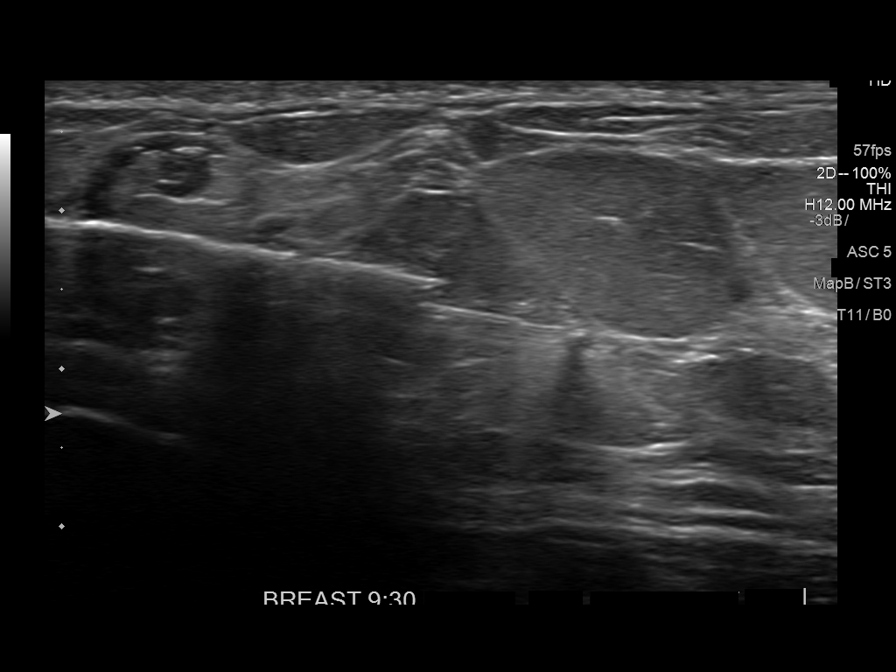

[8 of 8 positions shown; findings below may reference images not displayed]



Using sterile technique and 1% Lidocaine as local anesthetic, under
direct ultrasound visualization, a 12 gauge Kihyun device was
used to perform biopsy of an indeterminate left breast mass at [DATE],
7 cm from the nipple using a medial to lateral approach. At the
conclusion of the procedure a wing shaped tissue marker clip was
deployed into the biopsy cavity. Follow up 2 view mammogram was
performed and dictated separately.
IMPRESSION: Ultrasound guided biopsy of an indeterminate left breast mass at
[DATE], 7 cm from the nipple. No apparent complications.

## 2016-08-06 NOTE — Progress Notes (Signed)
[place]  Telephone:(336) [phone] Fax:(336) [phone]  ID: Madison Johnson OB: [birth year]  MR#: [id number]  PRF#:[id number]  Patient Care Team: No Pcp Per Patient as PCP - General (General Practice)  CHIEF COMPLAINT: Multifocal right breast DCIS  INTERVAL HISTORY: Patient returns to clinic today for routine 3 month evaluation. She continues to have occasional dizziness and nausea that appears to be positional in nature, improving with fewer episodes since starting on Letrozole in November.  She denies hot flashes.  She has no other neurologic complaints.  She reports occasional mid sternal stabbing pain that lasts a couple of hours and radiates to her left shoulder, then down her left arm, resolving on its own.  She denies diarrhea.  She reports constipation approximately twice per week, usually resolving without medication. She denies any recent fevers or illnesses. She has a good appetite and denies any weight loss. She denies any pain. She denies any chest pain or shortness of breath. She reports increased urinary frequency, no other urinary complaints. Patient offers no further specific complaints today.  REVIEW OF SYSTEMS:   Review of Systems  Constitutional: Negative.  Negative for fever, malaise/fatigue and weight loss.  HENT: Negative for congestion.   Respiratory: Negative.  Negative for cough and shortness of breath.   Cardiovascular: Positive for chest pain. Negative for leg swelling.  Gastrointestinal: Positive for constipation. Negative for abdominal pain, diarrhea, nausea and vomiting.  Genitourinary: Positive for frequency. Negative for urgency.  Musculoskeletal: Negative.   Neurological: Positive for dizziness. Negative for tingling, sensory change, weakness and headaches.  Psychiatric/Behavioral: Negative.  The patient is not nervous/anxious.     As per HPI. Otherwise, a complete review of systems is negative.  PAST MEDICAL HISTORY: Past Medical  History:  Diagnosis Date  . Cancer (HCC)    BREAST  . Diabetes mellitus without complication ([place])    BORDERLINE PER PT-MD TOLD PT TO EXERCISE AND DIET-NO MEDS CURRENTLY    PAST SURGICAL HISTORY: Past Surgical History:  Procedure Laterality Date  . BREAST BIOPSY Right 12/02/2015   path pending, stereo x2 areas  . BREAST BIOPSY Left 12/02/2015   [place] bx path pending  . EYE SURGERY     GROWTH REMOVED  . MASTECTOMY W/ SENTINEL NODE BIOPSY Right 01/20/2016   Procedure: MASTECTOMY WITH SENTINEL LYMPH NODE BIOPSY;  Surgeon: Leonie Green, MD;  Location: [facility] ORS;  Service: General;  Laterality: Right;  . SENTINEL NODE BIOPSY Right 01/20/2016   Procedure: SENTINEL NODE BIOPSY;  Surgeon: Leonie Green, MD;  Location: [facility] ORS;  Service: General;  Laterality: Right;  . TUBAL LIGATION      FAMILY HISTORY: Grandfather and maternal aunt with stomach cancer.     ADVANCED DIRECTIVES:    HEALTH MAINTENANCE: Social History  Substance Use Topics  . Smoking status: Never Smoker  . Smokeless tobacco: Not on file  . Alcohol use Yes     Comment: OCC     Colonoscopy:  PAP:  Bone density:  Lipid panel:  No Known Allergies  Current Outpatient Prescriptions  Medication Sig Dispense Refill  . ibuprofen (ADVIL,MOTRIN) 200 MG tablet Take 200 mg by mouth every 6 (six) hours as needed.    Marland Kitchen letrozole (FEMARA) 2.5 MG tablet Take 1 tablet (2.5 mg total) by mouth daily. 90 tablet 0   No current facility-administered medications for this visit.     OBJECTIVE: Vitals:   08/07/16 0956  BP: 120/81  Pulse: 90  Resp: 18  Temp: 97.7 F (  36.5 C)     Body mass index is 31.3 kg/m.    ECOG FS:0 - Asymptomatic  General: Well-developed, well-nourished, no acute distress. Eyes: Pink conjunctiva, anicteric sclera. Breasts: Well healing right mastectomy scar. Lungs: Clear to auscultation bilaterally. Heart: Regular rate and rhythm. No rubs, murmurs, or gallops. Abdomen: Soft, nontender,  nondistended. No organomegaly noted, normoactive bowel sounds. Musculoskeletal: No edema, cyanosis, or clubbing. Neuro: Alert, answering all questions appropriately. Cranial nerves grossly intact. Skin: No rashes or petechiae noted. Psych: Normal affect.  LAB RESULTS:  No results found for: NA, K, CL, CO2, GLUCOSE, BUN, CREATININE, CALCIUM, PROT, ALBUMIN, AST, ALT, ALKPHOS, BILITOT, GFRNONAA, GFRAA  No results found for: WBC, NEUTROABS, HGB, HCT, MCV, PLT   STUDIES: No results found.  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  PLAN:    1. Multifocal right breast DCIS: Final pathology did not reveal an invasive component, therefore patient did not require chemotherapy.  Because she had a total mastectomy, she does not require adjuvant XRT. Patient states she has an appointment with a Plastic surgeon in the near future. Tamoxifen was discontinued secondary to side effects. She was started on letrozole in November 2017, and has been tolerating well with no significant side effects.  Continue hormonal treatment for total 5 years completing in July 2022.  Schedule bone density scan within next 2 weeks, mammogram of left breast in May.  Return to clinic in 6 months for routine evaluation.  2. Genetic testing: Patient is BCRA 1 and 2 negative. 3. Dizziness: Appears positional possibly related to hypotension. Monitor.  Lucendia Herrlich, NP  08/07/16 11:11 AM  Patient was seen and evaluated independently and I agree with the assessment and plan as dictated above.  Lloyd Huger, MD 08/07/16 11:53 AM

## 2016-08-07 ENCOUNTER — Inpatient Hospital Stay: Payer: 59 | Attending: Oncology | Admitting: Oncology

## 2016-08-07 ENCOUNTER — Ambulatory Visit: Payer: 59 | Admitting: Oncology

## 2016-08-07 VITALS — BP 120/81 | HR 90 | Temp 97.7°F | Resp 18 | Wt 160.3 lb

## 2016-08-07 DIAGNOSIS — K5900 Constipation, unspecified: Secondary | ICD-10-CM | POA: Diagnosis not present

## 2016-08-07 DIAGNOSIS — D0511 Intraductal carcinoma in situ of right breast: Secondary | ICD-10-CM

## 2016-08-07 DIAGNOSIS — R079 Chest pain, unspecified: Secondary | ICD-10-CM | POA: Insufficient documentation

## 2016-08-07 DIAGNOSIS — Z800 Family history of malignant neoplasm of digestive organs: Secondary | ICD-10-CM | POA: Diagnosis not present

## 2016-08-07 DIAGNOSIS — R110 Nausea: Secondary | ICD-10-CM | POA: Insufficient documentation

## 2016-08-07 DIAGNOSIS — Z170 Estrogen receptor positive status [ER+]: Secondary | ICD-10-CM | POA: Insufficient documentation

## 2016-08-07 DIAGNOSIS — Z9011 Acquired absence of right breast and nipple: Secondary | ICD-10-CM

## 2016-08-07 DIAGNOSIS — R42 Dizziness and giddiness: Secondary | ICD-10-CM | POA: Insufficient documentation

## 2016-08-07 DIAGNOSIS — Z79811 Long term (current) use of aromatase inhibitors: Secondary | ICD-10-CM | POA: Insufficient documentation

## 2016-08-07 DIAGNOSIS — Z853 Personal history of malignant neoplasm of breast: Secondary | ICD-10-CM | POA: Diagnosis not present

## 2016-08-07 NOTE — Progress Notes (Signed)
Patient states she has been a little constipated.  Informed her that she can take stool softeners if needed.

## 2016-09-01 ENCOUNTER — Other Ambulatory Visit: Payer: Self-pay | Admitting: *Deleted

## 2016-09-01 MED ORDER — LETROZOLE 2.5 MG PO TABS: 2.5000 mg | ORAL_TABLET | Freq: Every day | ORAL | 1 refills | Status: DC

## 2016-09-04 ENCOUNTER — Encounter: Payer: Self-pay | Admitting: Emergency Medicine

## 2016-09-04 ENCOUNTER — Emergency Department: Payer: 59

## 2016-09-04 ENCOUNTER — Emergency Department
Admission: EM | Admit: 2016-09-04 | Discharge: 2016-09-04 | Disposition: A | Discharge status: Home / Self Care | Payer: 59 | Attending: Emergency Medicine | Admitting: Emergency Medicine

## 2016-09-04 DIAGNOSIS — Z791 Long term (current) use of non-steroidal anti-inflammatories (NSAID): Secondary | ICD-10-CM | POA: Insufficient documentation

## 2016-09-04 DIAGNOSIS — Z853 Personal history of malignant neoplasm of breast: Secondary | ICD-10-CM | POA: Insufficient documentation

## 2016-09-04 DIAGNOSIS — R0789 Other chest pain: Secondary | ICD-10-CM | POA: Diagnosis not present

## 2016-09-04 DIAGNOSIS — R072 Precordial pain: Secondary | ICD-10-CM | POA: Diagnosis present

## 2016-09-04 DIAGNOSIS — Z79899 Other long term (current) drug therapy: Secondary | ICD-10-CM | POA: Insufficient documentation

## 2016-09-04 DIAGNOSIS — R0602 Shortness of breath: Secondary | ICD-10-CM | POA: Diagnosis not present

## 2016-09-04 LAB — CBC
HEMATOCRIT: 41.4 % (ref 35.0–47.0)
Hemoglobin: 14.3 g/dL (ref 12.0–16.0)
MCH: 29.7 pg (ref 26.0–34.0)
MCHC: 34.4 g/dL (ref 32.0–36.0)
MCV: 86.3 fL (ref 80.0–100.0)
PLATELETS: 292 10*3/uL (ref 150–440)
RBC: 4.8 MIL/uL (ref 3.80–5.20)
RDW: 13.2 % (ref 11.5–14.5)
WBC: 7.4 10*3/uL (ref 3.6–11.0)

## 2016-09-04 LAB — BASIC METABOLIC PANEL
Anion gap: 7 (ref 5–15)
BUN: 16 mg/dL (ref 6–20)
CALCIUM: 8.8 mg/dL — AB (ref 8.9–10.3)
CO2: 24 mmol/L (ref 22–32)
CREATININE: 0.61 mg/dL (ref 0.44–1.00)
Chloride: 106 mmol/L (ref 101–111)
GFR calc Af Amer: >60 mL/min (ref >60)
GLUCOSE: 113 mg/dL — AB (ref 65–99)
Potassium: 3.8 mmol/L (ref 3.5–5.1)
SODIUM: 137 mmol/L (ref 135–145)

## 2016-09-04 LAB — TROPONIN I: Troponin I: <0.03 ng/mL (ref <0.03)

## 2016-09-04 MED ORDER — PANTOPRAZOLE SODIUM 20 MG PO TBEC: 20.0000 mg | DELAYED_RELEASE_TABLET | Freq: Every day | ORAL | 1 refills | Status: DC

## 2016-09-04 NOTE — ED Provider Notes (Signed)
[facility] Emergency Department Provider Note   ____________________________________________    I have reviewed the triage vital signs and the nursing notes.   HISTORY  Chief Complaint Chest Pain     HPI Madison Johnson is a 41 y.o. female who complains of intermittent chest pain over the last 2 weeks. She reports a sharp pain along her inferior sternum which has no consistent pattern. Does not appear to be related to eating or exertion, she is able to exercise without difficulty. She does have a history of diabetes. She does not smoke. She does not know her cholesterol. She has not had pain this morning. No shortness of breath. No recent travel. No calf pain or swelling   Past Medical History:  Diagnosis Date  . Cancer (HCC)    BREAST  . Diabetes mellitus without complication ([place])    BORDERLINE PER PT-MD TOLD PT TO EXERCISE AND DIET-NO MEDS CURRENTLY    Patient Active Problem List   Diagnosis Date Noted  . 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 BREAST BIOPSY Right 12/02/2015   path pending, stereo x2 areas  . BREAST BIOPSY Left 12/02/2015   [place] bx path pending  . EYE SURGERY     GROWTH REMOVED  . MASTECTOMY W/ SENTINEL NODE BIOPSY Right 01/20/2016   Procedure: MASTECTOMY WITH SENTINEL LYMPH NODE BIOPSY;  Surgeon: Leonie Green, MD;  Location: [facility] ORS;  Service: General;  Laterality: Right;  . SENTINEL NODE BIOPSY Right 01/20/2016   Procedure: SENTINEL NODE BIOPSY;  Surgeon: Leonie Green, MD;  Location: [facility] ORS;  Service: General;  Laterality: Right;  . TUBAL LIGATION      Prior to Admission medications   Medication Sig Start Date End Date Taking? Authorizing Provider  ibuprofen (ADVIL,MOTRIN) 200 MG tablet Take 200 mg by mouth every 6 (six) hours as needed.    Historical Provider, MD  letrozole (FEMARA) 2.5 MG tablet Take 1 tablet (2.5 mg total) by  mouth daily. 09/01/16   Lloyd Huger, MD  pantoprazole (PROTONIX) 20 MG tablet Take 1 tablet (20 mg total) by mouth daily. 09/04/16 09/04/17  Lavonia Drafts, MD     Allergies Patient has no known allergies.  No family history on file.  Social History Social History  Substance Use Topics  . Smoking status: Never Smoker  . Smokeless tobacco: Not on file  . Alcohol use Yes     Comment: OCC    Review of Systems  Constitutional: No fever/chills  Cardiovascular: As above Respiratory: Denies shortness of breath. Gastrointestinal: No abdominal pain.  No nausea, no vomiting.    Musculoskeletal: Negative for back pain. Skin: Negative for rash. Neurological: Negative for headaches   10-point ROS otherwise negative.  ____________________________________________   PHYSICAL EXAM:  VITAL SIGNS: ED Triage Vitals  Enc Vitals Group     BP 09/04/16 0900 127/81     Pulse Rate 09/04/16 0900 66     Resp 09/04/16 0900 18     Temp 09/04/16 0900 98.1 F (36.7 C)     Temp Source 09/04/16 0900 Oral     SpO2 09/04/16 0900 99 %     Weight 09/04/16 0902 155 lb (70.3 kg)     Height 09/04/16 0902 4' 11"  (1.499 m)     Head Circumference --      Peak Flow --      Pain Score --  Pain Loc --      Pain Edu? --      Excl. in [place]? --     Constitutional: Alert and oriented. No acute distress. Pleasant and interactive Eyes: Conjunctivae are normal.   Nose: No congestion/rhinnorhea. Mouth/Throat: Mucous membranes are moist.    Cardiovascular: Normal rate, regular rhythm. Grossly normal heart sounds.  Good peripheral circulation.Tenderness to palpation along the inferior sternum that appears to repeat her symptoms Respiratory: Normal respiratory effort.  No retractions.  Gastrointestinal: Soft and nontender. No distention.  No CVA tenderness. Genitourinary: deferred Musculoskeletal: No lower extremity tenderness nor edema.  Warm and well perfused Neurologic:  Normal speech and language.  No gross focal neurologic deficits are appreciated.  Skin:  Skin is warm, dry and intact. No rash noted. Psychiatric: Mood and affect are normal. Speech and behavior are normal.  ____________________________________________   LABS (all labs ordered are listed, but only abnormal results are displayed)  Labs Reviewed  BASIC METABOLIC PANEL - Abnormal; Notable for the following:       Result Value   Glucose, Bld 113 (*)    Calcium 8.8 (*)    All other components within normal limits  CBC  TROPONIN I   ____________________________________________  EKG  ED ECG REPORT I, Lavonia Drafts, the attending physician, personally viewed and interpreted this ECG.  Date: 09/04/2016  Rate: 69 Rhythm: normal sinus rhythm QRS Axis: normal Intervals: normal ST/T Wave abnormalities: normal Conduction Disturbances: none Narrative Interpretation: Q waves anteriorly  ____________________________________________  RADIOLOGY  Chest x-ray unremarkable ____________________________________________   PROCEDURES  Procedure(s) performed: No    Critical Care performed: No ____________________________________________   INITIAL IMPRESSION / ASSESSMENT AND PLAN / ED COURSE  Pertinent labs & imaging results that were available during my care of the patient were reviewed by me and considered in my medical decision making (see chart for details).  Patient presents with two-week history of chest pain. No discomfort today. She is certainly tender around the sternum, costochondritis is a possibility. However given her risk factors of diabetes, age do feel she will require provocative testing of close follow-up with cardiology. She is asymptomatic today. Her troponin is normal, her chest x-ray is normal and she is very well-appearing. I have asked her to follow up closely with cardiology and she agrees, she will avoid exertion until clearance by cardiology      ____________________________________________   FINAL CLINICAL IMPRESSION(S) / ED DIAGNOSES  Final diagnoses:  Atypical chest pain      NEW MEDICATIONS STARTED DURING THIS VISIT:  Discharge Medication List as of 09/04/2016 10:36 AM    START taking these medications   Details  pantoprazole (PROTONIX) 20 MG tablet Take 1 tablet (20 mg total) by mouth daily., Starting Mon 09/04/2016, Until Tue 09/04/2017, Print         Note:  This document was prepared using Dragon voice recognition software and may include unintentional dictation errors.    Lavonia Drafts, MD 09/04/16 1126

## 2016-09-04 NOTE — ED Triage Notes (Signed)
Chest pain x 2 weeks, intermittent.

## 2016-09-06 ENCOUNTER — Ambulatory Visit
Admission: RE | Admit: 2016-09-06 | Discharge: 2016-09-06 | Disposition: A | Discharge status: Home / Self Care | Payer: 59 | Source: Ambulatory Visit | Attending: Oncology | Admitting: Oncology

## 2016-09-06 DIAGNOSIS — Z79899 Other long term (current) drug therapy: Secondary | ICD-10-CM | POA: Diagnosis not present

## 2016-09-06 DIAGNOSIS — D0511 Intraductal carcinoma in situ of right breast: Secondary | ICD-10-CM | POA: Insufficient documentation

## 2016-09-15 DIAGNOSIS — I208 Other forms of angina pectoris: Secondary | ICD-10-CM | POA: Diagnosis not present

## 2016-09-15 DIAGNOSIS — E782 Mixed hyperlipidemia: Secondary | ICD-10-CM | POA: Diagnosis not present

## 2016-09-19 DIAGNOSIS — R740 Nonspecific elevation of levels of transaminase and lactic acid dehydrogenase [LDH]: Secondary | ICD-10-CM | POA: Diagnosis not present

## 2016-09-27 DIAGNOSIS — R7303 Prediabetes: Secondary | ICD-10-CM | POA: Diagnosis not present

## 2016-09-27 DIAGNOSIS — K219 Gastro-esophageal reflux disease without esophagitis: Secondary | ICD-10-CM | POA: Diagnosis not present

## 2016-09-27 DIAGNOSIS — I208 Other forms of angina pectoris: Secondary | ICD-10-CM | POA: Diagnosis not present

## 2016-09-27 DIAGNOSIS — K7581 Nonalcoholic steatohepatitis (NASH): Secondary | ICD-10-CM | POA: Diagnosis not present

## 2016-09-28 DIAGNOSIS — K7581 Nonalcoholic steatohepatitis (NASH): Secondary | ICD-10-CM | POA: Insufficient documentation

## 2016-09-29 DIAGNOSIS — E782 Mixed hyperlipidemia: Secondary | ICD-10-CM | POA: Diagnosis not present

## 2016-09-29 DIAGNOSIS — R0602 Shortness of breath: Secondary | ICD-10-CM | POA: Diagnosis not present

## 2016-11-02 ENCOUNTER — Ambulatory Visit: Payer: 59 | Admitting: Oncology

## 2016-12-05 ENCOUNTER — Ambulatory Visit
Admission: RE | Admit: 2016-12-05 | Discharge: 2016-12-05 | Disposition: A | Discharge status: Home / Self Care | Payer: 59 | Source: Ambulatory Visit | Attending: Oncology | Admitting: Oncology

## 2016-12-05 ENCOUNTER — Other Ambulatory Visit: Payer: Self-pay | Admitting: Oncology

## 2016-12-05 DIAGNOSIS — N6320 Unspecified lump in the left breast, unspecified quadrant: Secondary | ICD-10-CM | POA: Diagnosis not present

## 2016-12-05 DIAGNOSIS — R928 Other abnormal and inconclusive findings on diagnostic imaging of breast: Secondary | ICD-10-CM | POA: Diagnosis not present

## 2016-12-05 DIAGNOSIS — Z853 Personal history of malignant neoplasm of breast: Secondary | ICD-10-CM | POA: Insufficient documentation

## 2016-12-05 DIAGNOSIS — D0511 Intraductal carcinoma in situ of right breast: Secondary | ICD-10-CM

## 2017-01-11 DIAGNOSIS — R7303 Prediabetes: Secondary | ICD-10-CM | POA: Diagnosis not present

## 2017-01-11 DIAGNOSIS — K7581 Nonalcoholic steatohepatitis (NASH): Secondary | ICD-10-CM | POA: Diagnosis not present

## 2017-01-19 DIAGNOSIS — N760 Acute vaginitis: Secondary | ICD-10-CM | POA: Diagnosis not present

## 2017-02-04 NOTE — Progress Notes (Deleted)
[place]  Telephone:(336) [phone] Fax:(336) [phone]  ID: [name] OB: [birth year]  MR#: [id number]  EQA#:[id number]  Patient Care Team: Glendon Axe, MD as PCP - General (Internal Medicine)  CHIEF COMPLAINT: Multifocal right breast DCIS  INTERVAL HISTORY: Patient returns to clinic today for routine 3 month evaluation. She continues to have occasional dizziness and nausea that appears to be positional in nature, improving with fewer episodes since starting on Letrozole in November.  She denies hot flashes.  She has no other neurologic complaints.  She reports occasional mid sternal stabbing pain that lasts a couple of hours and radiates to her left shoulder, then down her left arm, resolving on its own.  She denies diarrhea.  She reports constipation approximately twice per week, usually resolving without medication. She denies any recent fevers or illnesses. She has a good appetite and denies any weight loss. She denies any pain. She denies any chest pain or shortness of breath. She reports increased urinary frequency, no other urinary complaints. Patient offers no further specific complaints today.  REVIEW OF SYSTEMS:   Review of Systems  Constitutional: Negative.  Negative for fever, malaise/fatigue and weight loss.  HENT: Negative for congestion.   Respiratory: Negative.  Negative for cough and shortness of breath.   Cardiovascular: Positive for chest pain. Negative for leg swelling.  Gastrointestinal: Positive for constipation. Negative for abdominal pain, diarrhea, nausea and vomiting.  Genitourinary: Positive for frequency. Negative for urgency.  Musculoskeletal: Negative.   Neurological: Positive for dizziness. Negative for tingling, sensory change, weakness and headaches.  Psychiatric/Behavioral: Negative.  The patient is not nervous/anxious.     As per HPI. Otherwise, a complete review of systems is negative.  PAST MEDICAL HISTORY: Past Medical  History:  Diagnosis Date  . Cancer (HCC)    BREAST  . Diabetes mellitus without complication ([place])    BORDERLINE PER PT-MD TOLD PT TO EXERCISE AND DIET-NO MEDS CURRENTLY    PAST SURGICAL HISTORY: Past Surgical History:  Procedure Laterality Date  . BREAST BIOPSY Right 12/02/2015   +  . BREAST BIOPSY Left 12/02/2015   [place] bx path pending  . EYE SURGERY     GROWTH REMOVED  . MASTECTOMY    . MASTECTOMY W/ SENTINEL NODE BIOPSY Right 01/20/2016   Procedure: MASTECTOMY WITH SENTINEL LYMPH NODE BIOPSY;  Surgeon: Leonie Green, MD;  Location: [facility] ORS;  Service: General;  Laterality: Right;  . SENTINEL NODE BIOPSY Right 01/20/2016   Procedure: SENTINEL NODE BIOPSY;  Surgeon: Leonie Green, MD;  Location: [facility] ORS;  Service: General;  Laterality: Right;  . TUBAL LIGATION      FAMILY HISTORY: Grandfather and maternal aunt with stomach cancer.     ADVANCED DIRECTIVES:    HEALTH MAINTENANCE: Social History  Substance Use Topics  . Smoking status: Never Smoker  . Smokeless tobacco: Not on file  . Alcohol use Yes     Comment: OCC     Colonoscopy:  PAP:  Bone density:  Lipid panel:  No Known Allergies  Current Outpatient Prescriptions  Medication Sig Dispense Refill  . ibuprofen (ADVIL,MOTRIN) 200 MG tablet Take 200 mg by mouth every 6 (six) hours as needed.    Marland Kitchen letrozole (FEMARA) 2.5 MG tablet Take 1 tablet (2.5 mg total) by mouth daily. 90 tablet 1  . pantoprazole (PROTONIX) 20 MG tablet Take 1 tablet (20 mg total) by mouth daily. 30 tablet 1   No current facility-administered medications for this visit.  OBJECTIVE: There were no vitals filed for this visit.   There is no height or weight on file to calculate BMI.    ECOG FS:0 - Asymptomatic  General: Well-developed, well-nourished, no acute distress. Eyes: Pink conjunctiva, anicteric sclera. Breasts: Well healing right mastectomy scar. Lungs: Clear to auscultation bilaterally. Heart: Regular rate and  rhythm. No rubs, murmurs, or gallops. Abdomen: Soft, nontender, nondistended. No organomegaly noted, normoactive bowel sounds. Musculoskeletal: No edema, cyanosis, or clubbing. Neuro: Alert, answering all questions appropriately. Cranial nerves grossly intact. Skin: No rashes or petechiae noted. Psych: Normal affect.  LAB RESULTS:  Lab Results  Component Value Date   NA 137 09/04/2016   K 3.8 09/04/2016   CL 106 09/04/2016   CO2 24 09/04/2016   GLUCOSE 113 (H) 09/04/2016   BUN 16 09/04/2016   CREATININE 0.61 09/04/2016   CALCIUM 8.8 (L) 09/04/2016   GFRNONAA >60 09/04/2016   GFRAA >60 09/04/2016    Lab Results  Component Value Date   WBC 7.4 09/04/2016   HGB 14.3 09/04/2016   HCT 41.4 09/04/2016   MCV 86.3 09/04/2016   PLT 292 09/04/2016     STUDIES: No results found.  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  PLAN:    1. Multifocal right breast DCIS: Final pathology did not reveal an invasive component, therefore patient did not require chemotherapy.  Because she had a total mastectomy, she does not require adjuvant XRT. Patient states she has an appointment with a Plastic surgeon in the near future. Tamoxifen was discontinued secondary to side effects. She was started on letrozole in November 2017, and has been tolerating well with no significant side effects.  Continue hormonal treatment for total 5 years completing in July 2022.  Schedule bone density scan within next 2 weeks, mammogram of left breast in May.  Return to clinic in 6 months for routine evaluation.  2. Genetic testing: Patient is BCRA 1 and 2 negative. 3. Dizziness: Appears positional possibly related to hypotension. Monitor.  Lucendia Herrlich, NP  02/04/17 10:21 PM  Patient was seen and evaluated independently and I agree with the assessment and plan as dictated above.  Lloyd Huger, MD 02/04/17 10:21 PM

## 2017-02-05 ENCOUNTER — Inpatient Hospital Stay: Payer: 59 | Admitting: Oncology

## 2017-02-05 DIAGNOSIS — Z853 Personal history of malignant neoplasm of breast: Secondary | ICD-10-CM | POA: Diagnosis not present

## 2017-02-27 ENCOUNTER — Other Ambulatory Visit: Payer: Self-pay | Admitting: *Deleted

## 2017-02-27 MED ORDER — LETROZOLE 2.5 MG PO TABS: 2.5000 mg | ORAL_TABLET | Freq: Every day | ORAL | 0 refills | Status: DC

## 2017-03-11 IMAGING — US US ABDOMEN LIMITED
1 series · 14 of 25 positions shown · non-contrast
Comparison: None.

CLINICAL DATA: Elevated LFTs

EXAM:
US ABDOMEN LIMITED - RIGHT UPPER QUADRANT

[Series 1: us abdomen limited · 0.26mm/px · 14 of 48 slices shown]
[im 1/48]
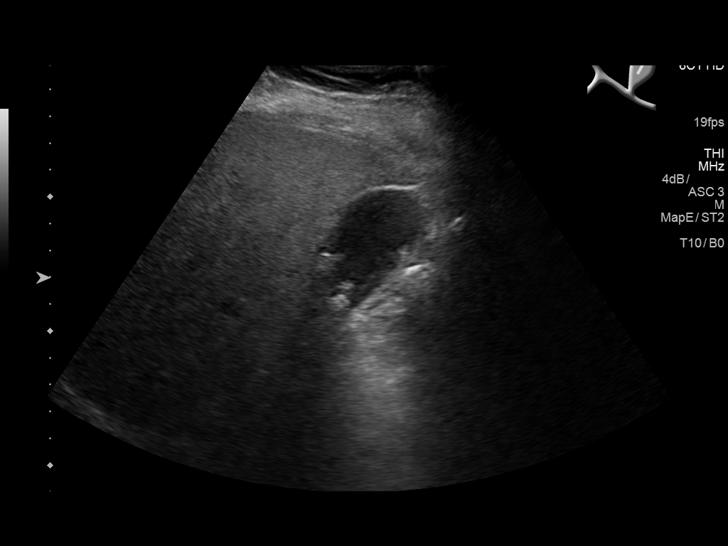
[im 4/48]
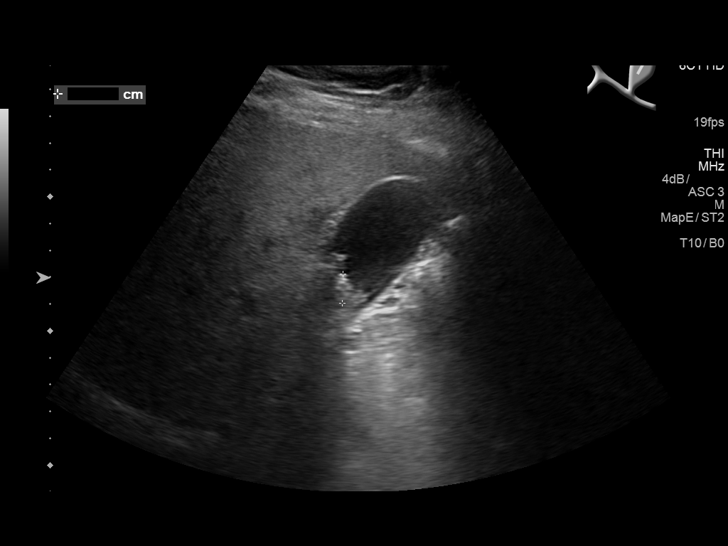
[im 8/48]
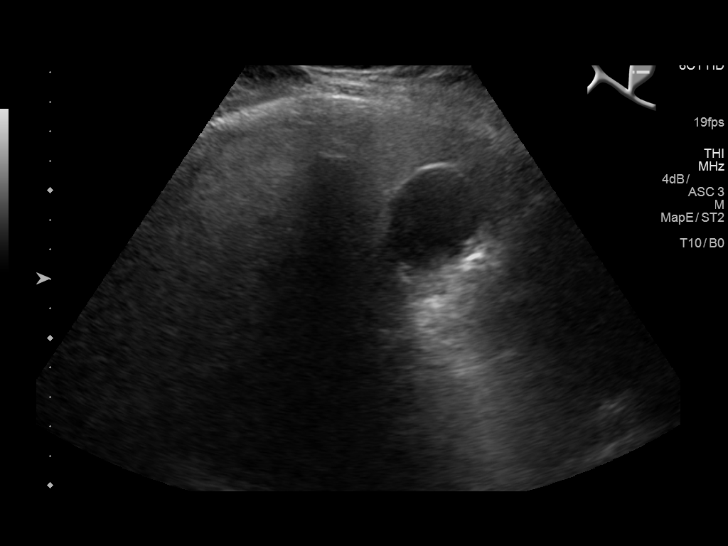
[im 12/48]
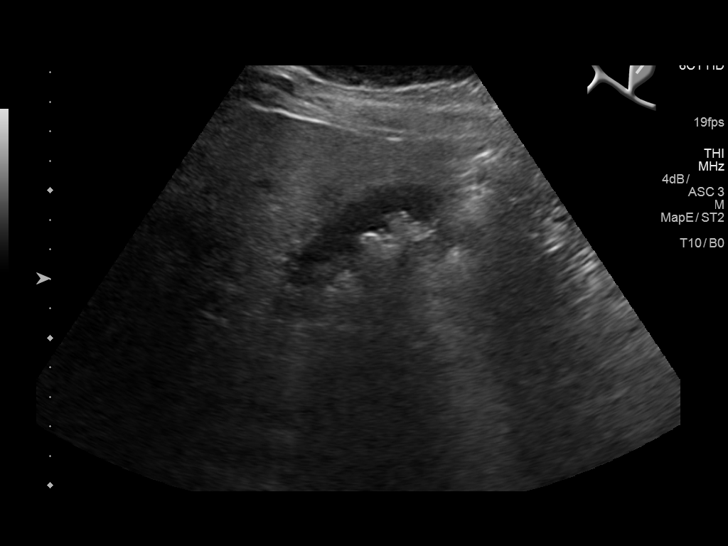
[im 16/48]
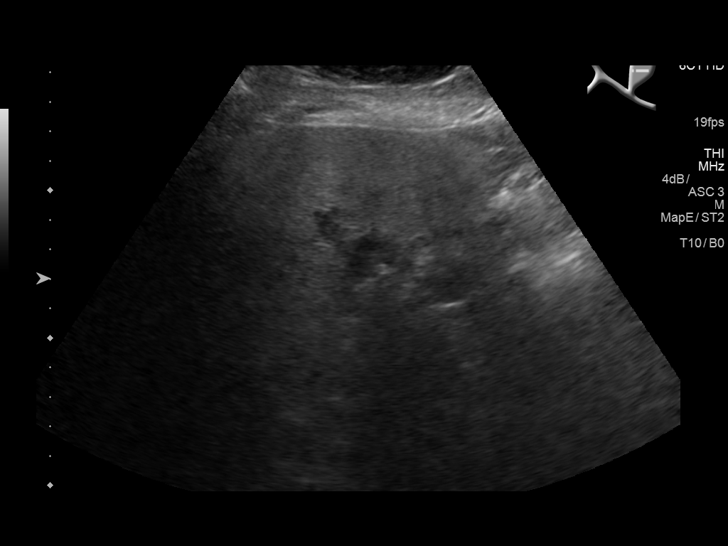
[im 18/48]
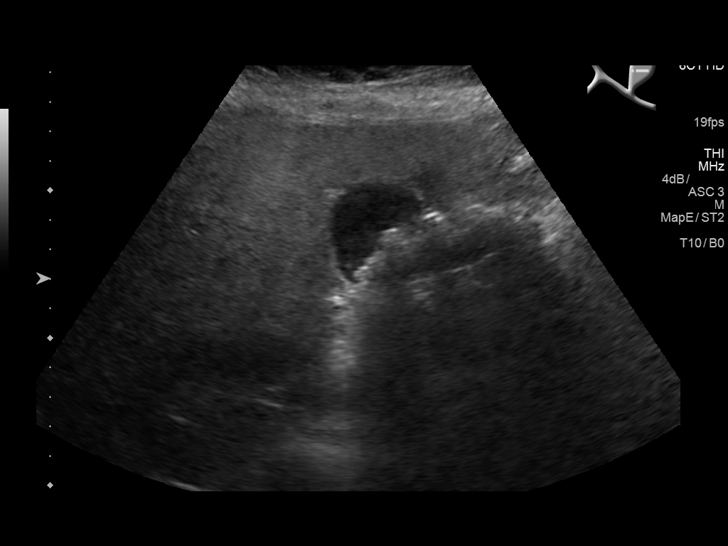
[im 22/48]
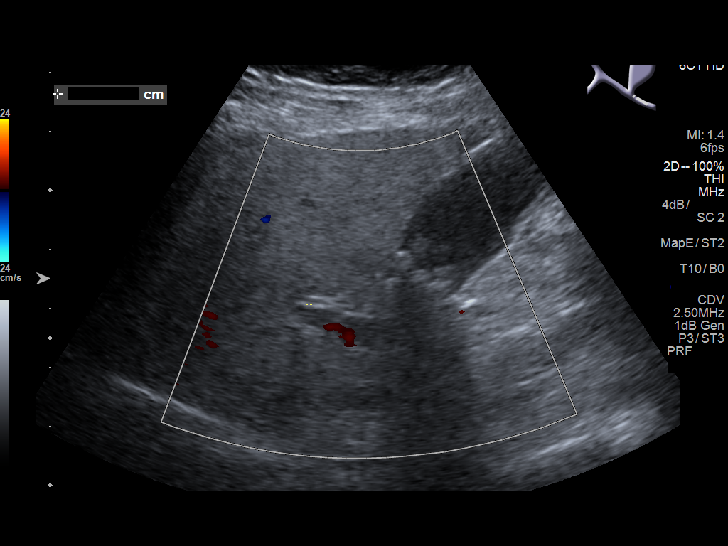
[im 26/48]
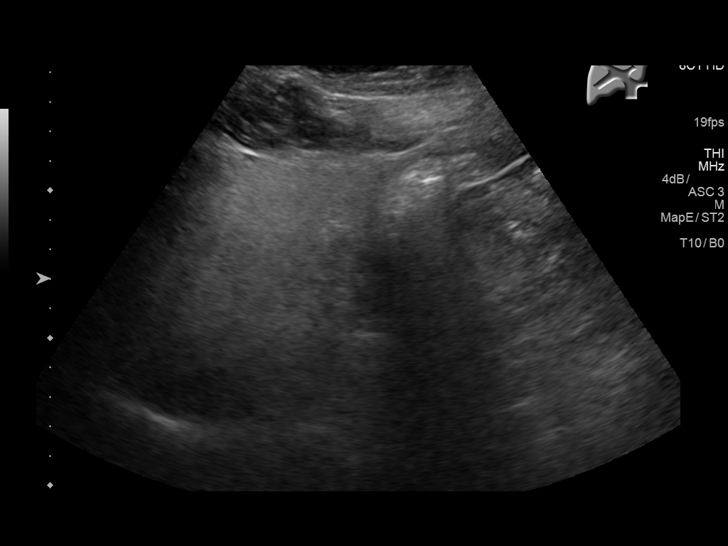
[im 30/48]
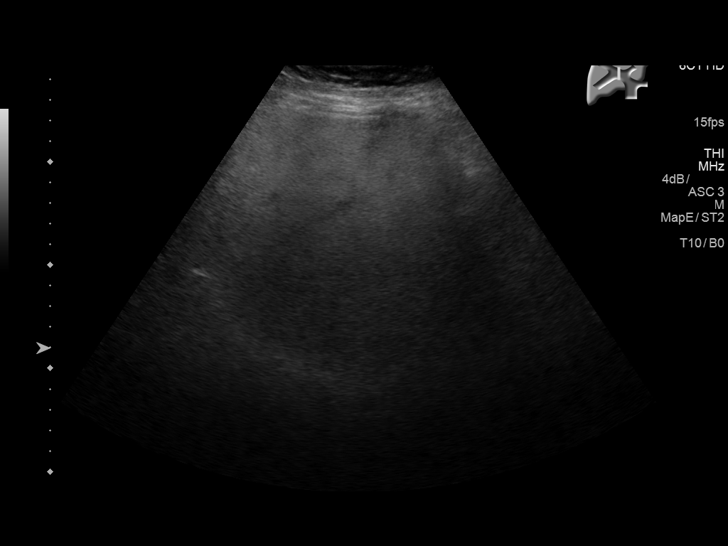
[im 32/48]
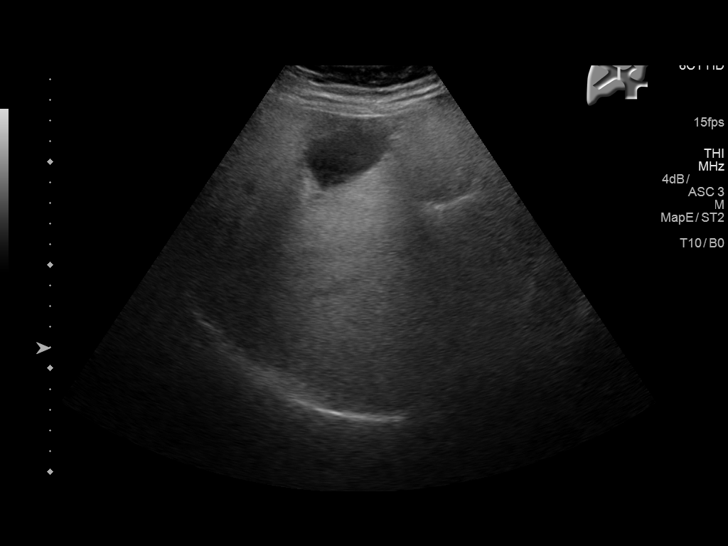
[im 36/48]
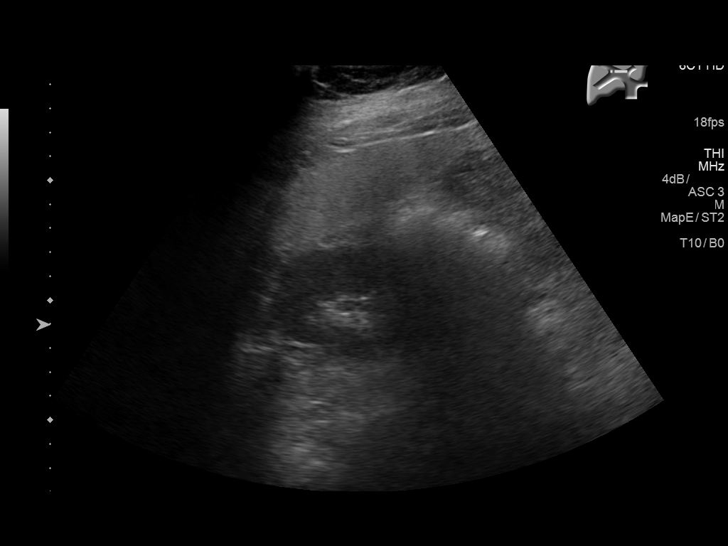
[im 40/48]
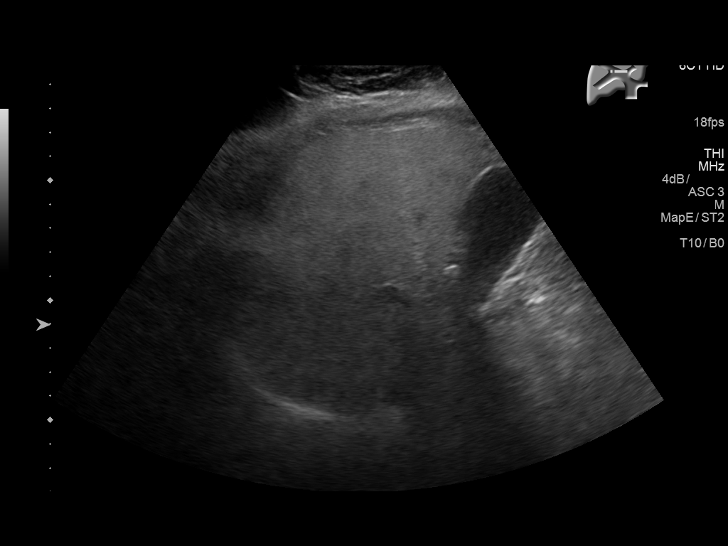
[im 44/48]
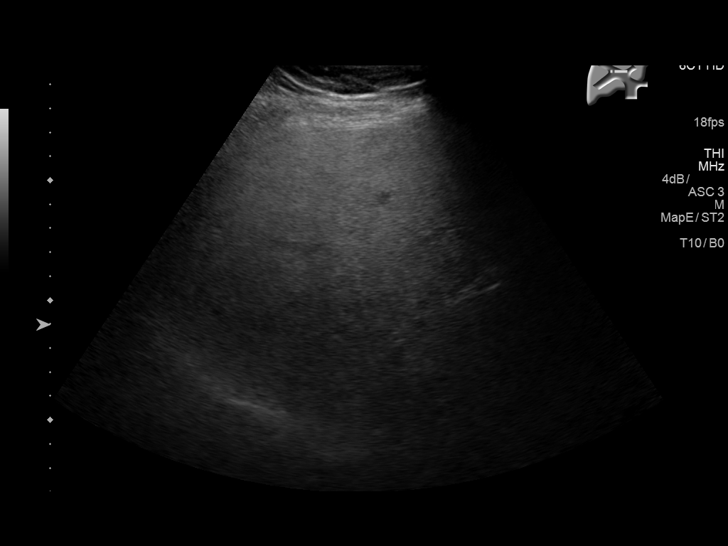
[im 48/48]
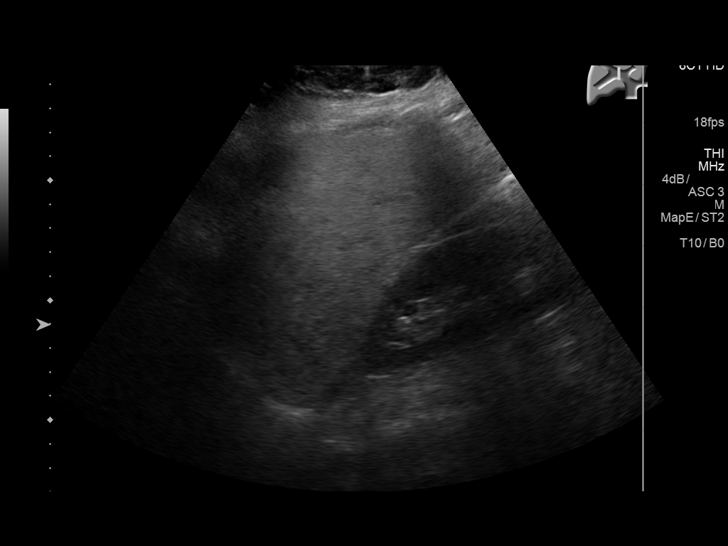

[14 of 25 positions shown; findings below may reference images not displayed]

FINDINGS: Gallbladder:

Well distended with multiple dependent gallstones. No wall
thickening or pericholecystic fluid is noted.

Common bile duct:

Diameter: 3 mm.

Liver:

Mildly increased in echogenicity consistent with fatty infiltration.
IMPRESSION: Fatty liver and cholelithiasis.  No complicating factors are noted.

## 2017-03-18 NOTE — Progress Notes (Signed)
[place]  Telephone:(336) [phone] [phone]) [phone]  ID: [name] OB: 01/06/1976  MR#: [id number]  DGL#:[id number]  Patient Care Team: Glendon Axe, MD as PCP - General (Internal Medicine)  CHIEF COMPLAINT: Multifocal right breast DCIS  INTERVAL HISTORY: Patient returns to clinic today for routine follow-up for multifocal right breast DCIS s/p total mastectomy. Initially she was taking tamoxifen but due to side effects she was switched to letrozole in November 2017. Since the change she no hot flashes or other neurologic complaints. She saw Dr. Tamala Julian on 02/05/17 to discuss post-surgical follow up and breast reconstruction. She had reported some swelling in the right upper arm intermittently which has since resolved. She says that Dr. Tamala Julian palpated tissue changes in her left breast at that time but did not order imaging. Dr. Thompson Caul note describes this as the 'upper extent of glandular tissue in the upper outer quadrant'. Exam was otherwise normal. Patient reports she performs self breast exams and is able to detect tissue changes in upper outer quadrant since pointed out by Dr. Tamala Julian. She denies any changes. Additionally she reports some cold intolerances and feeling of cold knees and elbows particularly at night when she falls asleep. She denies diarrhea, constipation, fever. She reports good appetite, denies weight loss. She was recently seen by GYN for post-coital bleeding which was diagnosed as yeast and she was started on course of antibiotics and diflucan. She denies other symptoms and reports that she generally feels well.   REVIEW OF SYSTEMS:   Review of Systems  Constitutional: Positive for chills. Negative for fever, malaise/fatigue and weight loss.  HENT: Negative for congestion.   Respiratory: Negative.  Negative for cough and shortness of breath.   Cardiovascular: Negative for chest pain, palpitations and leg swelling.  Gastrointestinal: Negative for  abdominal pain, constipation, diarrhea, nausea and vomiting.  Genitourinary: Negative for frequency and urgency.       Vaginal spotting  Musculoskeletal: Negative.   Neurological: Negative for dizziness, tingling, sensory change, weakness and headaches.  Endo/Heme/Allergies:       Cold intolerance  Psychiatric/Behavioral: Negative.  The patient is not nervous/anxious.     As per HPI. Otherwise, a complete review of systems is negative.  PAST MEDICAL HISTORY: Past Medical History:  Diagnosis Date  . Cancer (HCC)    BREAST  . Diabetes mellitus without complication ([place])    BORDERLINE PER PT-MD TOLD PT TO EXERCISE AND DIET-NO MEDS CURRENTLY    PAST SURGICAL HISTORY: Past Surgical History:  Procedure Laterality Date  . BREAST BIOPSY Right 12/02/2015   +  . BREAST BIOPSY Left 12/02/2015   [place] bx path pending  . EYE SURGERY     GROWTH REMOVED  . MASTECTOMY    . MASTECTOMY W/ SENTINEL NODE BIOPSY Right 01/20/2016   Procedure: MASTECTOMY WITH SENTINEL LYMPH NODE BIOPSY;  Surgeon: Leonie Green, MD;  Location: [facility] ORS;  Service: General;  Laterality: Right;  . SENTINEL NODE BIOPSY Right 01/20/2016   Procedure: SENTINEL NODE BIOPSY;  Surgeon: Leonie Green, MD;  Location: [facility] ORS;  Service: General;  Laterality: Right;  . TUBAL LIGATION      FAMILY HISTORY: Grandfather and maternal aunt with stomach cancer.     ADVANCED DIRECTIVES:    HEALTH MAINTENANCE: Social History  Substance Use Topics  . Smoking status: Never Smoker  . Smokeless tobacco: Not on file  . Alcohol use Yes     Comment: OCC     Colonoscopy:  PAP:  Bone density:  Lipid panel:  No Known Allergies  Current Outpatient Prescriptions  Medication Sig Dispense Refill  . doxycycline (VIBRA-TABS) 100 MG tablet     . fluconazole (DIFLUCAN) 150 MG tablet     . ibuprofen (ADVIL,MOTRIN) 200 MG tablet Take 200 mg by mouth every 6 (six) hours as needed.    Marland Kitchen letrozole (FEMARA) 2.5 MG tablet Take 1  tablet (2.5 mg total) by mouth daily. 90 tablet 3   No current facility-administered medications for this visit.     OBJECTIVE: Vitals:   03/19/17 1342  BP: (!) 128/92  Pulse: 99  Resp: 18  SpO2: 98%     Body mass index is 32.4 kg/m.    ECOG FS:0 - Asymptomatic  General: Well-developed, well-nourished, no acute distress. Eyes: Pink conjunctiva, anicteric sclera. Breasts: well healed right mastectomy scar. Breast exam deferred today per patient request.  Lungs: Clear to auscultation bilaterally. Heart: Regular rate and rhythm. No rubs, murmurs, or gallops. Abdomen: Soft, nontender, nondistended. No organomegaly noted, normoactive bowel sounds. Musculoskeletal: No edema, cyanosis, or clubbing. Neuro: Alert, answering all questions appropriately. Cranial nerves grossly intact. Skin: No rashes or petechiae noted. Psych: Normal affect.  LAB RESULTS:  Lab Results  Component Value Date   NA 137 09/04/2016   K 3.8 09/04/2016   CL 106 09/04/2016   CO2 24 09/04/2016   GLUCOSE 113 (H) 09/04/2016   BUN 16 09/04/2016   CREATININE 0.61 09/04/2016   CALCIUM 8.8 (L) 09/04/2016   GFRNONAA >60 09/04/2016   GFRAA >60 09/04/2016    Lab Results  Component Value Date   WBC 7.4 09/04/2016   HGB 14.3 09/04/2016   HCT 41.4 09/04/2016   MCV 86.3 09/04/2016   PLT 292 09/04/2016     STUDIES: No results found.  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  PLAN:    1. Multifocal right breast DCIS: Final pathology did not reveal an invasive component, therefore patient did not require chemotherapy.  Because she had a total mastectomy, she does not require adjuvant XRT. Patient seen by surgery in July for consideration of right breast reconstruction.  Surgical follow up in January 2019. She is tolerating letrozole well and will continue hormonal treatment for a total of 5 years completing in July 2022. Bone density scan from 09/06/16 was normal. Will repeat in 08/2018.  Mammogram from 12/05/16 was bi-rads category 2 with recommended follow up in 1 year. However, since that time patient has developed a palpable change in the tissue of the left breast and this was confirmed by surgery. She defers breast exam today but given this change I feel a repeat mammogram 6 months from May is warranted. Patient encouraged to continue self-exams and notify clinic of any changes. If mammogram is normal, will plan for her to return to clinic in 6 months for routine evaluation.  2. Genetic testing: Patient is BCRA 1 and 2 negative. 3. Dizziness: resolved 4. Cold intolerance: do not believe this is related to the letrozole. Discussed with patient to follow up with primary care for consideration of thyroid function testing.    Verlon Au, NP 03/19/17 2:45 PM   Patient was seen and evaluated independently and I agree with the assessment and plan as dictated above. Will defer to primary care for further evaluation of cold intolerance and thyroid testing.  Lloyd Huger, MD 03/20/17 8:40 AM

## 2017-03-19 ENCOUNTER — Inpatient Hospital Stay: Payer: 59 | Attending: Oncology | Admitting: Oncology

## 2017-03-19 VITALS — BP 128/92 | HR 99 | Resp 18 | Wt 160.4 lb

## 2017-03-19 DIAGNOSIS — Z170 Estrogen receptor positive status [ER+]: Secondary | ICD-10-CM | POA: Diagnosis not present

## 2017-03-19 DIAGNOSIS — Z79811 Long term (current) use of aromatase inhibitors: Secondary | ICD-10-CM | POA: Diagnosis not present

## 2017-03-19 DIAGNOSIS — Z79899 Other long term (current) drug therapy: Secondary | ICD-10-CM

## 2017-03-19 DIAGNOSIS — Z9013 Acquired absence of bilateral breasts and nipples: Secondary | ICD-10-CM

## 2017-03-19 DIAGNOSIS — D0511 Intraductal carcinoma in situ of right breast: Secondary | ICD-10-CM | POA: Diagnosis not present

## 2017-03-19 DIAGNOSIS — E119 Type 2 diabetes mellitus without complications: Secondary | ICD-10-CM | POA: Diagnosis not present

## 2017-03-19 MED ORDER — LETROZOLE 2.5 MG PO TABS: 2.5000 mg | ORAL_TABLET | Freq: Every day | ORAL | 3 refills | Status: DC

## 2017-03-19 NOTE — Progress Notes (Signed)
Patient is here for follow up, she is doing well. She does mention that at night when trying to sleep she gets really cold behind her knees down to her feet and in the bend of her elbows. She has aching but its worse at night. Saw her cardiologist 7 months ago but this was a new problem since seeing them.

## 2017-03-20 NOTE — Addendum Note (Signed)
Addended by: Verlon Au on: 03/20/2017 03:50 PM   Modules accepted: Orders

## 2017-03-20 NOTE — Addendum Note (Signed)
Addended by: Verlon Au on: 03/20/2017 03:11 PM   Modules accepted: Orders

## 2017-03-28 ENCOUNTER — Other Ambulatory Visit: Payer: Self-pay | Admitting: Oncology

## 2017-03-29 ENCOUNTER — Other Ambulatory Visit: Payer: 59

## 2017-04-02 ENCOUNTER — Other Ambulatory Visit: Payer: 59

## 2017-04-06 DIAGNOSIS — K7581 Nonalcoholic steatohepatitis (NASH): Secondary | ICD-10-CM | POA: Diagnosis not present

## 2017-04-06 DIAGNOSIS — E782 Mixed hyperlipidemia: Secondary | ICD-10-CM | POA: Diagnosis not present

## 2017-04-09 DIAGNOSIS — R319 Hematuria, unspecified: Secondary | ICD-10-CM | POA: Diagnosis not present

## 2017-04-11 DIAGNOSIS — R3129 Other microscopic hematuria: Secondary | ICD-10-CM | POA: Diagnosis not present

## 2017-04-11 DIAGNOSIS — E039 Hypothyroidism, unspecified: Secondary | ICD-10-CM | POA: Diagnosis not present

## 2017-04-11 DIAGNOSIS — J029 Acute pharyngitis, unspecified: Secondary | ICD-10-CM | POA: Diagnosis not present

## 2017-04-14 DIAGNOSIS — R3129 Other microscopic hematuria: Secondary | ICD-10-CM | POA: Insufficient documentation

## 2017-05-29 ENCOUNTER — Ambulatory Visit
Admission: RE | Admit: 2017-05-29 | Discharge: 2017-05-29 | Disposition: A | Discharge status: Home / Self Care | Payer: 59 | Source: Ambulatory Visit | Attending: Nurse Practitioner | Admitting: Nurse Practitioner

## 2017-05-29 DIAGNOSIS — N6320 Unspecified lump in the left breast, unspecified quadrant: Secondary | ICD-10-CM | POA: Diagnosis not present

## 2017-05-29 DIAGNOSIS — D0511 Intraductal carcinoma in situ of right breast: Secondary | ICD-10-CM | POA: Diagnosis present

## 2017-05-29 DIAGNOSIS — R928 Other abnormal and inconclusive findings on diagnostic imaging of breast: Secondary | ICD-10-CM | POA: Diagnosis not present

## 2017-06-21 DIAGNOSIS — Z23 Encounter for immunization: Secondary | ICD-10-CM | POA: Diagnosis not present

## 2017-07-04 DIAGNOSIS — E039 Hypothyroidism, unspecified: Secondary | ICD-10-CM | POA: Diagnosis not present

## 2017-07-04 DIAGNOSIS — R3129 Other microscopic hematuria: Secondary | ICD-10-CM | POA: Diagnosis not present

## 2017-07-13 DIAGNOSIS — E039 Hypothyroidism, unspecified: Secondary | ICD-10-CM | POA: Diagnosis not present

## 2017-07-13 DIAGNOSIS — R3129 Other microscopic hematuria: Secondary | ICD-10-CM | POA: Diagnosis not present

## 2017-07-13 DIAGNOSIS — R1313 Dysphagia, pharyngeal phase: Secondary | ICD-10-CM | POA: Diagnosis not present

## 2017-07-19 ENCOUNTER — Other Ambulatory Visit: Payer: Self-pay | Admitting: Internal Medicine

## 2017-07-19 DIAGNOSIS — R1313 Dysphagia, pharyngeal phase: Secondary | ICD-10-CM

## 2017-07-20 ENCOUNTER — Other Ambulatory Visit: Payer: Self-pay | Admitting: Internal Medicine

## 2017-07-20 DIAGNOSIS — R3129 Other microscopic hematuria: Secondary | ICD-10-CM

## 2017-07-27 ENCOUNTER — Encounter: Payer: Self-pay | Admitting: Urology

## 2017-07-27 ENCOUNTER — Ambulatory Visit (INDEPENDENT_AMBULATORY_CARE_PROVIDER_SITE_OTHER): Payer: 59 | Admitting: Urology

## 2017-07-27 VITALS — BP 127/86 | HR 80 | Ht 59.0 in | Wt 158.9 lb

## 2017-07-27 DIAGNOSIS — R3129 Other microscopic hematuria: Secondary | ICD-10-CM | POA: Diagnosis not present

## 2017-07-27 LAB — URINALYSIS, COMPLETE
Bilirubin, UA: NEGATIVE
GLUCOSE, UA: NEGATIVE
Ketones, UA: NEGATIVE
NITRITE UA: NEGATIVE
Protein, UA: NEGATIVE
Specific Gravity, UA: 1.025 (ref 1.005–1.030)
Urobilinogen, Ur: 1 mg/dL (ref 0.2–1.0)
pH, UA: 5 (ref 5.0–7.5)

## 2017-07-27 LAB — MICROSCOPIC EXAMINATION

## 2017-07-27 NOTE — Progress Notes (Signed)
07/27/2017 9:36 AM   Madison Johnson [birth year] [id number]  Referring provider: Glendon Axe, MD [facility] [place], [place] [address]  Chief Complaint  Patient presents with  . Hematuria    HPI: The patient is a 42 year old female who presents today for evaluation of microscopic hematuria.  She has multiple urinalysis that show microscopic hematuria.  It remains persistent today.  She denies history of gross hematuria.  She has no history of nephrolithiasis.  She was experienced UTI symptoms over the summer 2018, but those have resolved.  She did have some abnormal vaginal bleeding and saw a gynecologist for this.  That has since improved.  The patient denies any smoking history.  Of note, the patient is currently scheduled for CT hematuria protocol on August 07, 2017   PMH: Past Medical History:  Diagnosis Date  . Cancer (HCC)    BREAST  . Diabetes mellitus without complication ([place])    BORDERLINE PER PT-MD TOLD PT TO EXERCISE AND DIET-NO MEDS CURRENTLY    Surgical History: Past Surgical History:  Procedure Laterality Date  . BREAST BIOPSY Right 12/02/2015   +  . BREAST BIOPSY Left 12/02/2015   neg  . EYE SURGERY     GROWTH REMOVED  . MASTECTOMY    . MASTECTOMY W/ SENTINEL NODE BIOPSY Right 01/20/2016   Procedure: MASTECTOMY WITH SENTINEL LYMPH NODE BIOPSY;  Surgeon: Leonie Green, MD;  Location: [facility] ORS;  Service: General;  Laterality: Right;  . SENTINEL NODE BIOPSY Right 01/20/2016   Procedure: SENTINEL NODE BIOPSY;  Surgeon: Leonie Green, MD;  Location: [facility] ORS;  Service: General;  Laterality: Right;  . TUBAL LIGATION      Home Medications:  Allergies as of 07/27/2017      Reactions   Azithromycin Hives      Medication List        Accurate as of 07/27/17  9:36 AM. Always use your most recent med list.          ibuprofen 200 MG tablet Commonly known as:  ADVIL,MOTRIN Take 200 mg by mouth every 6 (six) hours as  needed.   letrozole 2.5 MG tablet Commonly known as:  FEMARA Take 1 tablet (2.5 mg total) by mouth daily.   levothyroxine 25 MCG tablet Commonly known as:  SYNTHROID, LEVOTHROID TAKE 1 TAB DAILY ON EMPTY STOMACH WITH GLASS OF WATER AT LEAST 30-60 MIN BEFORE BREAKFAST   vitamin C 100 MG tablet Take by mouth.   VITAMIN D-1000 MAX ST 1000 units tablet Generic drug:  Cholecalciferol Take by mouth.       Allergies:  Allergies  Allergen Reactions  . Azithromycin Hives    Family History: Family History  Problem Relation Age of Onset  . Breast cancer Neg Hx     Social History:  reports that  has never smoked. she has never used smokeless tobacco. She reports that she drinks alcohol. She reports that she does not use drugs.  ROS: UROLOGY Frequent Urination?: Yes Hard to postpone urination?: No Burning/pain with urination?: Yes Get up at night to urinate?: Yes Leakage of urine?: No Urine stream starts and stops?: No Trouble starting stream?: No Do you have to strain to urinate?: No Blood in urine?: No Urinary tract infection?: No Sexually transmitted disease?: No Injury to kidneys or bladder?: No Painful intercourse?: No Weak stream?: No Currently pregnant?: No Vaginal bleeding?: No Last menstrual period?: n  Gastrointestinal Nausea?: No Vomiting?: No Indigestion/heartburn?: No Diarrhea?: No  Constipation?: No  Constitutional Fever: No Night sweats?: No Weight loss?: No Fatigue?: No  Skin Skin rash/lesions?: No Itching?: No  Eyes Blurred vision?: No Double vision?: No  Ears/Nose/Throat Sore throat?: No Sinus problems?: No  Hematologic/Lymphatic Swollen glands?: No Easy bruising?: No  Cardiovascular Leg swelling?: No Chest pain?: No  Respiratory Cough?: No Shortness of breath?: No  Endocrine Excessive thirst?: No  Musculoskeletal Back pain?: No Joint pain?: No  Neurological Headaches?: No Dizziness?:  No  Psychologic Depression?: No Anxiety?: No  Physical Exam: BP 127/86 (BP Location: Left Arm, Patient Position: Sitting, Cuff Size: Normal)   Pulse 80   Ht 4' 11"  (1.499 m)   Wt 158 lb 14.4 oz (72.1 kg)   BMI 32.09 kg/m   Constitutional:  Alert and oriented, No acute distress. HEENT: [place] AT, moist mucus membranes.  Trachea midline, no masses. Cardiovascular: No clubbing, cyanosis, or edema. Respiratory: Normal respiratory effort, no increased work of breathing. GI: Abdomen is soft, nontender, nondistended, no abdominal masses GU: No CVA tenderness.  Skin: No rashes, bruises or suspicious lesions. Lymph: No cervical or inguinal adenopathy. Neurologic: Grossly intact, no focal deficits, moving all 4 extremities. Psychiatric: Normal mood and affect.  Laboratory Data: Lab Results  Component Value Date   WBC 7.4 09/04/2016   HGB 14.3 09/04/2016   HCT 41.4 09/04/2016   MCV 86.3 09/04/2016   PLT 292 09/04/2016    Lab Results  Component Value Date   CREATININE 0.61 09/04/2016    No results found for: PSA  No results found for: TESTOSTERONE  No results found for: HGBA1C  Urinalysis No results found for: COLORURINE, APPEARANCEUR, LABSPEC, PHURINE, GLUCOSEU, HGBUR, BILIRUBINUR, KETONESUR, PROTEINUR, UROBILINOGEN, NITRITE, LEUKOCYTESUR  Assessment & Plan:    1. Microscopic hematuria -Patient is already scheduled for CT hematuria protocol on January 15th.  We will have her follow-up after this test to go over results as well as perform office cystoscopy  Return for cysto (after CT scheduled for 08/07/17 by PCP).  Nickie Retort, MD  [facility] [address] [place], [place] [address] [phone]

## 2017-08-07 ENCOUNTER — Ambulatory Visit
Admission: RE | Admit: 2017-08-07 | Discharge: 2017-08-07 | Disposition: A | Discharge status: Home / Self Care | Payer: 59 | Source: Ambulatory Visit | Attending: Internal Medicine | Admitting: Internal Medicine

## 2017-08-07 DIAGNOSIS — R3129 Other microscopic hematuria: Secondary | ICD-10-CM

## 2017-08-07 DIAGNOSIS — K760 Fatty (change of) liver, not elsewhere classified: Secondary | ICD-10-CM | POA: Insufficient documentation

## 2017-08-07 DIAGNOSIS — N949 Unspecified condition associated with female genital organs and menstrual cycle: Secondary | ICD-10-CM | POA: Diagnosis not present

## 2017-08-07 DIAGNOSIS — N852 Hypertrophy of uterus: Secondary | ICD-10-CM | POA: Insufficient documentation

## 2017-08-07 MED ORDER — IOPAMIDOL (ISOVUE-300) INJECTION 61%
150.0000 mL | Freq: Once | INTRAVENOUS | Status: AC | PRN
  Administered 2017-08-07: 150 mL via INTRAVENOUS

## 2017-08-08 DIAGNOSIS — J0120 Acute ethmoidal sinusitis, unspecified: Secondary | ICD-10-CM | POA: Diagnosis not present

## 2017-08-08 DIAGNOSIS — Z853 Personal history of malignant neoplasm of breast: Secondary | ICD-10-CM | POA: Diagnosis not present

## 2017-08-09 ENCOUNTER — Ambulatory Visit
Admission: RE | Admit: 2017-08-09 | Discharge: 2017-08-09 | Disposition: A | Discharge status: Home / Self Care | Payer: 59 | Source: Ambulatory Visit | Attending: Internal Medicine | Admitting: Internal Medicine

## 2017-08-09 DIAGNOSIS — R3129 Other microscopic hematuria: Secondary | ICD-10-CM | POA: Diagnosis not present

## 2017-08-09 DIAGNOSIS — R1314 Dysphagia, pharyngoesophageal phase: Secondary | ICD-10-CM

## 2017-08-09 DIAGNOSIS — R1313 Dysphagia, pharyngeal phase: Secondary | ICD-10-CM | POA: Insufficient documentation

## 2017-08-09 NOTE — Therapy (Addendum)
Madison Johnson [place], [place], [address] Phone: [phone]   Fax:     Modified Barium Swallow  Patient Details  Name: Madison Johnson MRN: [id number] Date of Birth: October 02, 1975 No Data Recorded  Encounter Date: 08/09/2017  End of Session - 08/09/17 1720    Visit Number  1    Number of Visits  1    Date for SLP Re-Evaluation  08/09/17    SLP Start Time  75    SLP Stop Time   1400    SLP Time Calculation (min)  60 min    Activity Tolerance  Patient tolerated treatment well       Past Medical History:  Diagnosis Date  . Cancer (HCC)    BREAST  . Diabetes mellitus without complication ([place])    BORDERLINE PER PT-MD TOLD PT TO EXERCISE AND DIET-NO MEDS CURRENTLY    Past Surgical History:  Procedure Laterality Date  . BREAST BIOPSY Right 12/02/2015   +  . BREAST BIOPSY Left 12/02/2015   neg  . EYE SURGERY     GROWTH REMOVED  . MASTECTOMY    . MASTECTOMY W/ SENTINEL NODE BIOPSY Right 01/20/2016   Procedure: MASTECTOMY WITH SENTINEL LYMPH NODE BIOPSY;  Surgeon: Leonie Green, MD;  Location: [facility] ORS;  Service: General;  Laterality: Right;  . SENTINEL NODE BIOPSY Right 01/20/2016   Procedure: SENTINEL NODE BIOPSY;  Surgeon: Leonie Green, MD;  Location: [facility] ORS;  Service: General;  Laterality: Right;  . TUBAL LIGATION      There were no vitals filed for this visit.      Subjective: Patient behavior: (alertness, ability to follow instructions, etc.): patient was A/O x4; engaged easily in conversation w/ SLP. She was worried if her Thyroid could be related to the swallow issue she felt: "something stuck in my throat at times" w/ foods. She denied this this issues has significantly impacted her overall eating/drinking; denied weight loss or change of foods. No reports of difficulty swallowing liquids. No reports of Reflux issues. Native dentition.  Chief complaint: dysphagia   Objective:   Radiological Procedure: A videoflouroscopic evaluation of oral-preparatory, reflex initiation, and pharyngeal phases of the swallow was performed; as well as a screening of the upper esophageal phase.  I. POSTURE: upright   II. VIEW: lateral; A-P III. COMPENSATORY STRATEGIES: none indicated  IV. BOLUSES ADMINISTERED:  Thin Liquid: 5 trials  Nectar-thick Liquid: 1 trial  Honey-thick Liquid: NT  Puree: 3 trials  Mechanical Soft: 2 trials V. RESULTS OF EVALUATION: A. ORAL PREPARATORY PHASE: (The lips, tongue, and velum are observed for strength and coordination)       **Overall Severity Rating: WFL. Bolus management appeared [facility] w/ timely A-P transfer for swallowing and appropriate oral clearing w/ all trial consistencies. No deficits in OM strength or ROM noted.  B. SWALLOW INITIATION/REFLEX: (The reflex is normal if "triggered" by the time the bolus reached the base of the tongue)  **Overall Severity Rating: grossly WFL. Timing of the pharyngeal swallow appeared approximately at the level of the valleculae. Liquids were transferred quickly and moved directly to the mid-pharyngeal space as swallow initiation occurred. No laryngeal penetration or aspiration occurred.  C. PHARYNGEAL PHASE: (Pharyngeal function is normal if the bolus shows rapid, smooth, and continuous transit through the pharynx and there is no pharyngeal residue after the swallow)  **Overall Severity Rating: [facility]. No pharyngeal residue remained post swallow w/ all trial consistencies indicating adequate laryngeal  excursion and pharyngeal pressure during the swallow.   D. LARYNGEAL PENETRATION: (Material entering into the laryngeal inlet/vestibule but not aspirated): NONE  E. ASPIRATION: NONE F. ESOPHAGEAL PHASE: (Screening of the upper esophagus): mild narrowing in the lower Cervical Esophagus w/ what appeared to be an outpouching of tissue DURING the swallow - this did not appear to trap or hinder bolus motility from fully  clearing the Cervical Esophagus. With min time, all bolus material appeared to clear the Esophagus during A-P viewing.   ASSESSMENT: Pt appeared to present w/ adequate oropharyngeal phase swallowing function w/ no apparent physiological deficits noted; oropharyngeal strength/ROM appeared [facility]. No laryngeal penetration or aspiration occurred during this study today. During the oral phase, bolus management appeared [facility] w/ timely A-P transfer for swallowing and appropriate oral clearing w/ all trial consistencies. No deficits in OM strength or ROM noted. During the pharyngeal phase, no pharyngeal residue remained post swallow w/ all trial consistencies indicating adequate laryngeal excursion and pharyngeal pressure during the swallow. Timing of the pharyngeal swallow initiation appeared to occur at approximately the level of the valleculae. Liquids were transferred quickly and moved directly to the mid-pharyngeal space as swallow initiation occurred. No laryngeal penetration or aspiration occurred. Of note, during the Esophageal phase of swallowing, a mild narrowing in the lower Cervical Esophagus w/ what appeared to be an outpouching of tissue DURING the swallow occurred - this did not appear to trap or hinder bolus motility from fully clearing the Cervical Esophagus. With min time, all bolus material appeared to clear the Esophagus during A-P viewing. Unsure if this is related to the "stuck" feeling pt experiences at times w/ foods as any dysmotility in the Esophagus can put pressure on the upper esophageal sphincter superiorly(no complaints were noted during this study). Discussed general aspiration and Reflux precautions; ways to modify/moisten foods; encouraged small, single bites and sips w/ all foods/drinks and less carbonated drinks during meals.    PLAN/RECOMMENDATIONS:  A. Diet: Regular diet w/ thin liquids. Pills w/ liquid or in a Puree if easier for swallowing(applesauce)  B. Swallowing Precautions:  general aspiration and reflux precautions suggested  C. Recommended consultation to ENT/MD if concerned about her thyroid  D. Therapy recommendations: None  E. Results and recommendations were discussed w/ patient; video viewed after w/ patient; questions answered             Dysphagia, pharyngoesophageal phase  Dysphagia, pharyngeal - Plan: DG Swallowing Func-Speech Pathology, DG Swallowing Func-Speech Pathology  G-Codes - Aug 12, 2017 1721    Functional Assessment Tool Used  clinical judgement    Functional Limitations  Swallowing    Swallow Current Status (T1572)  At least 1 percent but less than 20 percent impaired, limited or restricted    Swallow Goal Status (I2035)  At least 1 percent but less than 20 percent impaired, limited or restricted    Swallow Discharge Status [phone])  At least 1 percent but less than 20 percent impaired, limited or restricted           Problem List Patient Active Problem List   Diagnosis Date Noted  . 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

## 2017-08-10 ENCOUNTER — Encounter: Payer: Self-pay | Admitting: Urology

## 2017-08-10 ENCOUNTER — Ambulatory Visit (INDEPENDENT_AMBULATORY_CARE_PROVIDER_SITE_OTHER): Payer: 59 | Admitting: Urology

## 2017-08-10 VITALS — BP 127/90 | HR 88 | Ht 60.0 in | Wt 158.0 lb

## 2017-08-10 DIAGNOSIS — R3129 Other microscopic hematuria: Secondary | ICD-10-CM | POA: Diagnosis not present

## 2017-08-10 LAB — URINALYSIS, COMPLETE
Bilirubin, UA: NEGATIVE
GLUCOSE, UA: NEGATIVE
Leukocytes, UA: NEGATIVE
NITRITE UA: NEGATIVE
UUROB: 0.2 mg/dL (ref 0.2–1.0)
pH, UA: 5.5 (ref 5.0–7.5)

## 2017-08-10 LAB — MICROSCOPIC EXAMINATION

## 2017-08-10 NOTE — Progress Notes (Signed)
   08/10/17  CC:  Chief Complaint  Patient presents with  . Cysto    HPI:  The patient is a 42 year old female who presents today for evaluation of microscopic hematuria.  She has multiple urinalysis that show microscopic hematuria.  It remains persistent today.  She denies history of gross hematuria.  She has no history of nephrolithiasis.  She was experiencing UTI symptoms over the summer 2018, but those have resolved.  She did have some abnormal vaginal bleeding and saw a gynecologist for this.  That has since improved.  The patient denies any smoking history.  Of note, the patient underwent a CT hematuria protocol ordered by her PCP which showed no source of her microscopic hematuria.  She did have some uterine findings that would recommend a pelvic exam and possible pelvic ultrasound.  Her PCP is Artie address this and recommend follow-up with gynecology   Blood pressure 127/90, pulse 88, height 5' (1.524 m), weight 158 lb (71.7 kg). NED. A&Ox3.   No respiratory distress   Abd soft, NT, ND Normal external genitalia with patent urethral meatus  Cystoscopy Procedure Note  Patient identification was confirmed, informed consent was obtained, and patient was prepped using Betadine solution.  Lidocaine jelly was administered per urethral meatus.    Preoperative abx where received prior to procedure.    Procedure: - Flexible cystoscope introduced, without any difficulty.   - Thorough search of the bladder revealed:    normal urethral meatus    normal urothelium    no stones    no ulcers     no tumors    no urethral polyps    no trabeculation  - Ureteral orifices were normal in position and appearance.  Post-Procedure: - Patient tolerated the procedure well  Assessment/ Plan:  1. Microscopic hematuria -Negative cystoscopy.  Repeat urinalysis in 1 year  Nickie Retort, MD

## 2017-08-13 DIAGNOSIS — R102 Pelvic and perineal pain: Secondary | ICD-10-CM | POA: Diagnosis not present

## 2017-08-22 DIAGNOSIS — N939 Abnormal uterine and vaginal bleeding, unspecified: Secondary | ICD-10-CM | POA: Diagnosis not present

## 2017-08-22 DIAGNOSIS — R102 Pelvic and perineal pain: Secondary | ICD-10-CM | POA: Diagnosis not present

## 2017-08-22 DIAGNOSIS — N941 Unspecified dyspareunia: Secondary | ICD-10-CM | POA: Diagnosis not present

## 2017-08-30 NOTE — H&P (Signed)
Madison Johnson is a 42 y.o. female here forTVH and bilateral salpingectomy Madison Johnson a 43 y.o.femalehere for Referred by Dr Earma Reading ovarian cyst .G5P3 s/p SVD x 3 Pt underwent a CT scan for microscopic hematuria and ctscan showed A right ovarian cyst 3.8x2.9 cm . Also the cervix appears to be enlarged . Pap 02/2017 Neg and no HR HPV . Pt is s/p an endometrial ablation in 2014 and continues to have bleeding 10-15 days of spotting monthly( initially improved bleeding , but spotting continued and now worse) . + h/o postcoital spotting and dyspareunia Which has cause her to stop engaging in intercourse  S/p partial right mastectomy for Breast cancer 2017 and is on Femara 2.5 mg daily   EMBX failed to show endometrial tissue  Pap 02/2017 neg , no HPV  U/s 08/22/17 shows :  Study Result   Result status: In process  Ut anteverted  Fibroids seen: 1)ant to endo=1.6cm 2)post to endo=1.4cm  Endo=3.19m  No FF seen in CDS's  LOV contains 2 cysts: 1)simple=1.6cm 2)complex=2cm; septation=0.15cm  ROV complex cyst=1.9cm              Past Medical History:  has a past medical history of Abnormal cytology, Cancer (CMS-HCC) (12/03/15), Gastroesophageal reflux disease without esophagitis (09/27/2016), Hyperlipidemia, mixed (26/86/1683, and Nonalcoholic steatohepatitis (NASH) (09/28/2016).  Past Surgical History:  has a past surgical history that includes Tubal ligation (03/27/2009); Dilation and curettage, diagnostic / therapeutic (10/02/2013); Mastectomy (Right, 01/20/2016); Endometrial ablation (2014); and Breast surgery (Right, 11/2015). Family History: family history includes No Known Problems in her father and mother. Social History:  reports that she has never smoked. She has never used smokeless tobacco. She reports that she drinks alcohol. She reports that she does not use drugs. OB/GYN History:          OB History    Gravida  5   Para  3   Term  3   Preterm      AB  2   Living  3     SAB  2   TAB      Ectopic      Molar      Multiple      Live Births  3          Allergies: is allergic to azithromycin. Medications:  Current Outpatient Medications:  .  ascorbic acid, vitamin C, (VITAMIN C) 100 MG tablet, Take 100 mg by mouth once daily., Disp: , Rfl:  .  blood glucose meter kit, Use as directed., Disp: 1 each, Rfl: 0 .  cholecalciferol (CHOLECALCIFEROL) 1,000 unit tablet, Take by mouth., Disp: , Rfl:  .  letrozole (FEMARA) 2.5 mg tablet, Take 2.5 mg by mouth once daily., Disp: , Rfl:  .  levothyroxine (SYNTHROID, LEVOTHROID) 25 MCG tablet, Take 1 tablet (25 mcg total) by mouth once daily. Take on an empty stomach with a glass of water at least 30-60 minutes before breakfast., Disp: 90 tablet, Rfl: 1  Review of Systems: General:                      No fatigue or weight loss Eyes:                           No vision changes Ears:                            No hearing  difficulty Respiratory:                No cough or shortness of breath Pulmonary:                  No asthma or shortness of breath Cardiovascular:           No chest pain, palpitations, dyspnea on exertion Gastrointestinal:          No abdominal bloating, chronic diarrhea, constipations, masses, pain or hematochezia Genitourinary:             No hematuria, dysuria, abnormal vaginal discharge, +pelvic pain,+ menometorrhagia, + dyspareunia Lymphatic:                   No swollen lymph nodes Musculoskeletal:         No muscle weakness Neurologic:                  No extremity weakness, syncope, seizure disorder Psychiatric:                  No history of depression, delusions or suicidal/homicidal ideation    Exam:      Vitals:   08/22/17 0930  BP: 122/81  Pulse: 81    Body mass index is 31.25 kg/m.  WDWN white/ female in NAD   Lungs: CTA  CV : RRR without murmur    Neck:  no thyromegaly Abdomen: soft , no mass, normal  active bowel sounds,  non-tender, no rebound tenderness Pelvic: tanner stage 5 ,  External genitalia: vulva /labia no lesions Urethra: no prolapse Vagina: normal physiologic d/c, adequate room for [facility] or LAVH  Cervix: no lesions, no cervical motion tenderness   Uterus: normal size shape and contour, non-tender Adnexa: no mass,  non-tender   Rectovaginal:  Impression:   The primary encounter diagnosis was Pelvic pain in female. Diagnoses of Vaginal spotting, unspecified, Dyspareunia, female, and Ovarian cyst, right were also pertinent to this visit. Right ovary smaller from CT scan     Plan:   I have spoken with the patient regarding treatment options including expectant management, , or surgical intervention. After a full discussion the pt elects to proceed with TVH  Vs LAVH . Ispoke  to Dr Grayland Ormond( oncologist) about the role for oophorectomy Given her breast cancer status . He does not see a role for prophylactic oophorectomy currently .  I will schedule her for a TVH and bilateral salpingectomy

## 2017-08-31 ENCOUNTER — Encounter
Admission: RE | Admit: 2017-08-31 | Discharge: 2017-08-31 | Disposition: A | Discharge status: Home / Self Care | Payer: 59 | Source: Ambulatory Visit | Attending: Obstetrics and Gynecology | Admitting: Obstetrics and Gynecology

## 2017-08-31 ENCOUNTER — Other Ambulatory Visit: Payer: Self-pay

## 2017-08-31 HISTORY — DX: Gastro-esophageal reflux disease without esophagitis: K21.9

## 2017-08-31 HISTORY — DX: Nonalcoholic steatohepatitis (NASH): K75.81

## 2017-08-31 HISTORY — DX: Hypothyroidism, unspecified: E03.9

## 2017-08-31 NOTE — Patient Instructions (Signed)
Your procedure is scheduled on: Friday, September 07, 2017  Report to [place]  To find out your arrival time please call [phone]           between 1PM - 3PM on Thursday, February 14th  Remember: Instructions that are not followed completely may result in serious medical risk, up to and including death, or upon the discretion of your surgeon and anesthesiologist your surgery may need to be rescheduled.     _X__ 1. Do not eat food after midnight the night before your procedure.                 No gum chewing or hard candies.                   You may drink clear liquids up to 2 hours                 before you are scheduled to arrive for your surgery-                   Clear Liquids include:  water, apple juice without pulp, clear carbohydrate                 drink such as Clearfast of [place]art[place], Black Coffee or Tea (Do not add                 anything to coffee or tea).  __X__2.  On the morning of surgery brush your teeth with toothpaste and water,                           you may rinse your mouth with mouthwash if you wish.                                        Do not swallow any toothpaste of mouthwash.     _X__ 3.  No Alcohol for 24 hours before or after surgery.   _X__ 4.  Do Not Smoke or use e-cigarettes For 24 Hours Prior to Your Surgery.                 Do not use any chewable tobacco products for at least 6 hours prior to                 surgery.  ____  5.  Bring all medications with you on the day of surgery if instructed.   ____  6.  Notify your doctor if there is any change in your medical condition      (cold, fever, infections).     Do not wear jewelry, make-up, hairpins, clips or nail polish. Do not wear lotions, powders, or perfumes. You may wear deodorant. Do not shave 48 hours prior to surgery. Men may shave face and neck. Do not bring valuables to the hospital.    [facility] is not responsible for any  belongings or valuables.  Contacts, dentures or bridgework may not be worn into surgery. Leave your suitcase in the car. After surgery it may be brought to your room. For patients admitted to the hospital, discharge time is determined by your treatment team.   Patients discharged the day of surgery will not be allowed to drive home.   Please read over the following fact sheets that you were given:  PREPARING FOR SURGERY    ____ Take these medicines the morning of surgery with A SIP OF WATER:    1. FEMARA  2. SYNTHROID  3.   4.  5.  6.  _X___ Fleet Enema (as directed) MORNING OF SURGERY   __X__ Use CHG Soap as directed (PAPERWORK INCLUDED)  _X___ Stop ALL ASPIRIN PRODUCTS NOW!!                      THIS INCLUDES GOODYS POWDERS  __X__ Stop Anti-inflammatories NOW!!               THIS INCLUDES IBUPROFEN / MOTRIN / ADVIL / ALEVE                    NAPROSYN   ____ Stop supplements until after surgery.    ____ Bring C-Pap to the hospital.   HAVE STOOL SOFTENERS AVAILABLE ONCE HOME  WEAR LOOSE FITTING CLOTHING TO GO HOME IN.

## 2017-09-03 ENCOUNTER — Encounter
Admission: RE | Admit: 2017-09-03 | Discharge: 2017-09-03 | Disposition: A | Discharge status: Home / Self Care | Payer: 59 | Source: Ambulatory Visit | Attending: Obstetrics and Gynecology | Admitting: Obstetrics and Gynecology

## 2017-09-03 DIAGNOSIS — Z01812 Encounter for preprocedural laboratory examination: Secondary | ICD-10-CM | POA: Insufficient documentation

## 2017-09-03 DIAGNOSIS — Z881 Allergy status to other antibiotic agents status: Secondary | ICD-10-CM | POA: Insufficient documentation

## 2017-09-03 DIAGNOSIS — K7581 Nonalcoholic steatohepatitis (NASH): Secondary | ICD-10-CM | POA: Diagnosis not present

## 2017-09-03 DIAGNOSIS — E782 Mixed hyperlipidemia: Secondary | ICD-10-CM | POA: Insufficient documentation

## 2017-09-03 DIAGNOSIS — Z79899 Other long term (current) drug therapy: Secondary | ICD-10-CM | POA: Insufficient documentation

## 2017-09-03 DIAGNOSIS — N941 Unspecified dyspareunia: Secondary | ICD-10-CM | POA: Insufficient documentation

## 2017-09-03 DIAGNOSIS — N83201 Unspecified ovarian cyst, right side: Secondary | ICD-10-CM | POA: Insufficient documentation

## 2017-09-03 DIAGNOSIS — Z9011 Acquired absence of right breast and nipple: Secondary | ICD-10-CM | POA: Diagnosis not present

## 2017-09-03 DIAGNOSIS — K219 Gastro-esophageal reflux disease without esophagitis: Secondary | ICD-10-CM | POA: Diagnosis not present

## 2017-09-03 DIAGNOSIS — Z0183 Encounter for blood typing: Secondary | ICD-10-CM | POA: Insufficient documentation

## 2017-09-03 DIAGNOSIS — Z79890 Hormone replacement therapy (postmenopausal): Secondary | ICD-10-CM | POA: Diagnosis not present

## 2017-09-03 DIAGNOSIS — R102 Pelvic and perineal pain: Secondary | ICD-10-CM | POA: Insufficient documentation

## 2017-09-03 LAB — BASIC METABOLIC PANEL
Anion gap: 10 (ref 5–15)
BUN: 13 mg/dL (ref 6–20)
CALCIUM: 9.1 mg/dL (ref 8.9–10.3)
CHLORIDE: 104 mmol/L (ref 101–111)
CO2: 24 mmol/L (ref 22–32)
CREATININE: 0.73 mg/dL (ref 0.44–1.00)
GFR calc Af Amer: >60 mL/min (ref >60)
GFR calc non Af Amer: >60 mL/min (ref >60)
GLUCOSE: 105 mg/dL — AB (ref 65–99)
Potassium: 3.7 mmol/L (ref 3.5–5.1)
Sodium: 138 mmol/L (ref 135–145)

## 2017-09-03 LAB — CBC
HCT: 44.5 % (ref 35.0–47.0)
HEMOGLOBIN: 15 g/dL (ref 12.0–16.0)
MCH: 29.2 pg (ref 26.0–34.0)
MCHC: 33.8 g/dL (ref 32.0–36.0)
MCV: 86.4 fL (ref 80.0–100.0)
PLATELETS: 306 10*3/uL (ref 150–440)
RBC: 5.15 MIL/uL (ref 3.80–5.20)
RDW: 13.7 % (ref 11.5–14.5)
WBC: 7.9 10*3/uL (ref 3.6–11.0)

## 2017-09-03 LAB — TYPE AND SCREEN
ABO/RH(D): O POS
Antibody Screen: NEGATIVE

## 2017-09-07 ENCOUNTER — Ambulatory Visit: Payer: 59 | Admitting: Anesthesiology

## 2017-09-07 ENCOUNTER — Encounter
Admission: AD | Disposition: A | Discharge status: Home / Self Care | Payer: Self-pay | Source: Ambulatory Visit | Attending: Obstetrics and Gynecology

## 2017-09-07 ENCOUNTER — Observation Stay
Admission: AD | Admit: 2017-09-07 | Discharge: 2017-09-08 | Disposition: A | Discharge status: Home / Self Care | Payer: 59 | Source: Ambulatory Visit | Attending: Obstetrics and Gynecology | Admitting: Obstetrics and Gynecology

## 2017-09-07 DIAGNOSIS — Z79890 Hormone replacement therapy (postmenopausal): Secondary | ICD-10-CM | POA: Diagnosis not present

## 2017-09-07 DIAGNOSIS — N946 Dysmenorrhea, unspecified: Secondary | ICD-10-CM | POA: Diagnosis not present

## 2017-09-07 DIAGNOSIS — Z9011 Acquired absence of right breast and nipple: Secondary | ICD-10-CM | POA: Insufficient documentation

## 2017-09-07 DIAGNOSIS — Z79899 Other long term (current) drug therapy: Secondary | ICD-10-CM | POA: Diagnosis not present

## 2017-09-07 DIAGNOSIS — D250 Submucous leiomyoma of uterus: Secondary | ICD-10-CM | POA: Diagnosis not present

## 2017-09-07 DIAGNOSIS — N920 Excessive and frequent menstruation with regular cycle: Principal | ICD-10-CM | POA: Insufficient documentation

## 2017-09-07 DIAGNOSIS — N83201 Unspecified ovarian cyst, right side: Secondary | ICD-10-CM | POA: Diagnosis present

## 2017-09-07 DIAGNOSIS — Z853 Personal history of malignant neoplasm of breast: Secondary | ICD-10-CM | POA: Insufficient documentation

## 2017-09-07 DIAGNOSIS — Z79811 Long term (current) use of aromatase inhibitors: Secondary | ICD-10-CM | POA: Insufficient documentation

## 2017-09-07 DIAGNOSIS — N888 Other specified noninflammatory disorders of cervix uteri: Secondary | ICD-10-CM | POA: Insufficient documentation

## 2017-09-07 DIAGNOSIS — N941 Unspecified dyspareunia: Secondary | ICD-10-CM | POA: Diagnosis not present

## 2017-09-07 DIAGNOSIS — Z9889 Other specified postprocedural states: Secondary | ICD-10-CM

## 2017-09-07 DIAGNOSIS — N72 Inflammatory disease of cervix uteri: Secondary | ICD-10-CM | POA: Diagnosis not present

## 2017-09-07 HISTORY — PX: VAGINAL HYSTERECTOMY: SHX2639

## 2017-09-07 HISTORY — PX: BILATERAL SALPINGECTOMY: SHX5743

## 2017-09-07 LAB — GLUCOSE, CAPILLARY: GLUCOSE-CAPILLARY: 118 mg/dL — AB (ref 65–99)

## 2017-09-07 LAB — POCT PREGNANCY, URINE: Preg Test, Ur: NEGATIVE

## 2017-09-07 LAB — ABO/RH: ABO/RH(D): O POS

## 2017-09-07 SURGERY — HYSTERECTOMY, VAGINAL
Anesthesia: General

## 2017-09-07 MED ORDER — ONDANSETRON HCL 4 MG/2ML IJ SOLN: 4.0000 mg | Freq: Once | INTRAMUSCULAR | Status: DC | PRN

## 2017-09-07 MED ORDER — CEFAZOLIN SODIUM-DEXTROSE 2-4 GM/100ML-% IV SOLN
INTRAVENOUS | Status: AC
  Filled 2017-09-07: qty 100

## 2017-09-07 MED ORDER — ONDANSETRON HCL 4 MG PO TABS
4.0000 mg | ORAL_TABLET | Freq: Four times a day (QID) | ORAL | Status: DC | PRN
  Administered 2017-09-08: 4 mg via ORAL
  Filled 2017-09-07: qty 1

## 2017-09-07 MED ORDER — PHENYLEPHRINE HCL 10 MG/ML IJ SOLN
INTRAMUSCULAR | Status: DC | PRN
  Administered 2017-09-07: 100 ug via INTRAVENOUS

## 2017-09-07 MED ORDER — ACETAMINOPHEN 10 MG/ML IV SOLN
INTRAVENOUS | Status: DC | PRN
  Administered 2017-09-07: 1000 mg via INTRAVENOUS

## 2017-09-07 MED ORDER — CEFAZOLIN SODIUM-DEXTROSE 2-4 GM/100ML-% IV SOLN
2.0000 g | Freq: Once | INTRAVENOUS | Status: AC
  Administered 2017-09-07: 2 g via INTRAVENOUS

## 2017-09-07 MED ORDER — ONDANSETRON HCL 4 MG/2ML IJ SOLN
INTRAMUSCULAR | Status: DC | PRN
  Administered 2017-09-07: 4 mg via INTRAVENOUS

## 2017-09-07 MED ORDER — FENTANYL CITRATE (PF) 100 MCG/2ML IJ SOLN
INTRAMUSCULAR | Status: AC
  Filled 2017-09-07: qty 2

## 2017-09-07 MED ORDER — ONDANSETRON HCL 4 MG/2ML IJ SOLN: 4.0000 mg | Freq: Four times a day (QID) | INTRAMUSCULAR | Status: DC | PRN

## 2017-09-07 MED ORDER — LIDOCAINE-EPINEPHRINE 1 %-1:100000 IJ SOLN
INTRAMUSCULAR | Status: DC | PRN
  Administered 2017-09-07: 10 mL

## 2017-09-07 MED ORDER — FENTANYL CITRATE (PF) 100 MCG/2ML IJ SOLN
25.0000 ug | INTRAMUSCULAR | Status: DC | PRN
  Administered 2017-09-07 (×4): 25 ug via INTRAVENOUS

## 2017-09-07 MED ORDER — OXYCODONE-ACETAMINOPHEN 5-325 MG PO TABS
1.0000 | ORAL_TABLET | ORAL | Status: DC | PRN
  Administered 2017-09-07 – 2017-09-08 (×2): 2 via ORAL
  Filled 2017-09-07 (×2): qty 2

## 2017-09-07 MED ORDER — SILVER NITRATE-POT NITRATE 75-25 % EX MISC
CUTANEOUS | Status: AC
  Filled 2017-09-07: qty 1

## 2017-09-07 MED ORDER — LIDOCAINE HCL (CARDIAC) 20 MG/ML IV SOLN
INTRAVENOUS | Status: DC | PRN
  Administered 2017-09-07: 40 mg via INTRAVENOUS

## 2017-09-07 MED ORDER — FAMOTIDINE 20 MG PO TABS
ORAL_TABLET | ORAL | Status: AC
  Filled 2017-09-07: qty 1

## 2017-09-07 MED ORDER — DEXAMETHASONE SODIUM PHOSPHATE 10 MG/ML IJ SOLN
INTRAMUSCULAR | Status: AC
  Filled 2017-09-07: qty 1

## 2017-09-07 MED ORDER — ACETAMINOPHEN NICU IV SYRINGE 10 MG/ML
INTRAVENOUS | Status: AC
  Filled 2017-09-07: qty 1

## 2017-09-07 MED ORDER — PROPOFOL 10 MG/ML IV BOLUS
INTRAVENOUS | Status: AC
  Filled 2017-09-07: qty 20

## 2017-09-07 MED ORDER — KETOROLAC TROMETHAMINE 30 MG/ML IJ SOLN
INTRAMUSCULAR | Status: AC
  Filled 2017-09-07: qty 1

## 2017-09-07 MED ORDER — MIDAZOLAM HCL 2 MG/2ML IJ SOLN
INTRAMUSCULAR | Status: DC | PRN
  Administered 2017-09-07: 2 mg via INTRAVENOUS

## 2017-09-07 MED ORDER — ROCURONIUM BROMIDE 100 MG/10ML IV SOLN
INTRAVENOUS | Status: DC | PRN
  Administered 2017-09-07: 50 mg via INTRAVENOUS

## 2017-09-07 MED ORDER — LACTATED RINGERS IV SOLN
INTRAVENOUS | Status: DC
  Administered 2017-09-07 – 2017-09-08 (×3): via INTRAVENOUS

## 2017-09-07 MED ORDER — LACTATED RINGERS IV SOLN: INTRAVENOUS | Status: DC

## 2017-09-07 MED ORDER — FENTANYL CITRATE (PF) 100 MCG/2ML IJ SOLN
INTRAMUSCULAR | Status: DC | PRN
  Administered 2017-09-07 (×4): 50 ug via INTRAVENOUS

## 2017-09-07 MED ORDER — KETOROLAC TROMETHAMINE 30 MG/ML IJ SOLN
INTRAMUSCULAR | Status: DC | PRN
  Administered 2017-09-07: 30 mg via INTRAVENOUS

## 2017-09-07 MED ORDER — FLEET ENEMA 7-19 GM/118ML RE ENEM: 1.0000 | ENEMA | Freq: Once | RECTAL | Status: DC

## 2017-09-07 MED ORDER — SUGAMMADEX SODIUM 200 MG/2ML IV SOLN
INTRAVENOUS | Status: DC | PRN
  Administered 2017-09-07: 143.4 mg via INTRAVENOUS

## 2017-09-07 MED ORDER — MIDAZOLAM HCL 2 MG/2ML IJ SOLN
INTRAMUSCULAR | Status: AC
  Filled 2017-09-07: qty 2

## 2017-09-07 MED ORDER — FENTANYL CITRATE (PF) 100 MCG/2ML IJ SOLN
INTRAMUSCULAR | Status: AC
  Administered 2017-09-07: 25 ug via INTRAVENOUS
  Filled 2017-09-07: qty 2

## 2017-09-07 MED ORDER — KETOROLAC TROMETHAMINE 30 MG/ML IJ SOLN
30.0000 mg | Freq: Three times a day (TID) | INTRAMUSCULAR | Status: DC
  Administered 2017-09-07 – 2017-09-08 (×3): 30 mg via INTRAVENOUS
  Filled 2017-09-07 (×4): qty 1

## 2017-09-07 MED ORDER — FAMOTIDINE 20 MG PO TABS
20.0000 mg | ORAL_TABLET | Freq: Once | ORAL | Status: AC
  Administered 2017-09-07: 20 mg via ORAL

## 2017-09-07 MED ORDER — MORPHINE SULFATE (PF) 2 MG/ML IV SOLN
1.0000 mg | INTRAVENOUS | Status: DC | PRN
  Administered 2017-09-07: 2 mg via INTRAVENOUS
  Filled 2017-09-07: qty 1

## 2017-09-07 MED ORDER — MORPHINE SULFATE (PF) 2 MG/ML IV SOLN
2.0000 mg | INTRAVENOUS | Status: DC | PRN
  Administered 2017-09-07 (×3): 2 mg via INTRAVENOUS
  Filled 2017-09-07 (×3): qty 1

## 2017-09-07 MED ORDER — DEXAMETHASONE SODIUM PHOSPHATE 10 MG/ML IJ SOLN
INTRAMUSCULAR | Status: DC | PRN
  Administered 2017-09-07: 8 mg via INTRAVENOUS

## 2017-09-07 MED ORDER — LACTATED RINGERS IV SOLN
INTRAVENOUS | Status: DC
  Administered 2017-09-07 (×2): via INTRAVENOUS

## 2017-09-07 MED ORDER — PROPOFOL 10 MG/ML IV BOLUS
INTRAVENOUS | Status: DC | PRN
  Administered 2017-09-07: 120 mg via INTRAVENOUS

## 2017-09-07 MED ORDER — LIDOCAINE HCL (PF) 2 % IJ SOLN
INTRAMUSCULAR | Status: AC
  Filled 2017-09-07: qty 10

## 2017-09-07 MED ORDER — LIDOCAINE-EPINEPHRINE 1 %-1:100000 IJ SOLN
INTRAMUSCULAR | Status: AC
  Filled 2017-09-07: qty 1

## 2017-09-07 SURGICAL SUPPLY — 33 items
BAG URINE DRAINAGE (UROLOGICAL SUPPLIES) ×3 IMPLANT
CANISTER SUCT 1200ML W/VALVE (MISCELLANEOUS) ×3 IMPLANT
CATH FOLEY 2WAY  5CC 16FR (CATHETERS) ×1
CATH ROBINSON RED A/P 16FR (CATHETERS) ×3 IMPLANT
CATH URTH 16FR FL 2W BLN LF (CATHETERS) ×2 IMPLANT
DRAPE PERI LITHO V/GYN (MISCELLANEOUS) ×3 IMPLANT
DRAPE SURG 17X11 SM STRL (DRAPES) ×3 IMPLANT
DRAPE UNDER BUTTOCK W/FLU (DRAPES) ×3 IMPLANT
ELECT REM PT RETURN 9FT ADLT (ELECTROSURGICAL) ×3
ELECTRODE REM PT RTRN 9FT ADLT (ELECTROSURGICAL) ×2 IMPLANT
GLOVE BIO SURGEON STRL SZ8 (GLOVE) ×3 IMPLANT
GOWN STRL REUS W/ TWL LRG LVL3 (GOWN DISPOSABLE) ×6 IMPLANT
GOWN STRL REUS W/ TWL XL LVL3 (GOWN DISPOSABLE) ×2 IMPLANT
GOWN STRL REUS W/TWL LRG LVL3 (GOWN DISPOSABLE) ×3
GOWN STRL REUS W/TWL XL LVL3 (GOWN DISPOSABLE) ×1
KIT TURNOVER CYSTO (KITS) ×3 IMPLANT
LABEL OR SOLS (LABEL) ×3 IMPLANT
NEEDLE HYPO 22GX1.5 SAFETY (NEEDLE) ×3 IMPLANT
PACK BASIN MINOR ARMC (MISCELLANEOUS) ×3 IMPLANT
PAD OB MATERNITY 4.3X12.25 (PERSONAL CARE ITEMS) ×3 IMPLANT
PAD PREP 24X41 OB/GYN DISP (PERSONAL CARE ITEMS) ×3 IMPLANT
SOL PREP PVP 2OZ (MISCELLANEOUS) ×3
SOLUTION PREP PVP 2OZ (MISCELLANEOUS) ×2 IMPLANT
SURGILUBE 2OZ TUBE FLIPTOP (MISCELLANEOUS) ×3 IMPLANT
SUT PDS 2-0 27IN (SUTURE) IMPLANT
SUT VIC AB 0 CT1 27 (SUTURE)
SUT VIC AB 0 CT1 27XCR 8 STRN (SUTURE) IMPLANT
SUT VIC AB 0 CT1 36 (SUTURE) ×6 IMPLANT
SUT VIC AB 2-0 SH 27 (SUTURE) ×3
SUT VIC AB 2-0 SH 27XBRD (SUTURE) ×6 IMPLANT
SYR 10ML LL (SYRINGE) ×3 IMPLANT
SYR CONTROL 10ML (SYRINGE) ×3 IMPLANT
WATER STERILE IRR 1000ML POUR (IV SOLUTION) ×3 IMPLANT

## 2017-09-07 NOTE — Progress Notes (Signed)
Patient ID: Madison Johnson, female   DOB: 1975/09/09, 42 y.o.   MRN: [id number] DOS TVH and bilateral salpingectomy . Pain controlled with IV morphine for the better part of the day . No on Oral pain meds  Urine output good  A: stable  P: d/c foley per her request and slow IVF  Anticipate d/c home in am

## 2017-09-07 NOTE — Anesthesia Post-op Follow-up Note (Signed)
Anesthesia QCDR form completed.        

## 2017-09-07 NOTE — Anesthesia Preprocedure Evaluation (Signed)
Anesthesia Evaluation  Patient identified by MRN, date of birth, ID band Patient awake    Reviewed: Allergy & Precautions, NPO status , Patient's Chart, lab work & pertinent test results  History of Anesthesia Complications Negative for: history of anesthetic complications  Airway Mallampati: II       Dental   Pulmonary neg sleep apnea, neg COPD,           Cardiovascular (-) hypertension(-) Past MI and (-) CHF (-) dysrhythmias (-) Valvular Problems/Murmurs     Neuro/Psych neg Seizures    GI/Hepatic Neg liver ROS, neg GERD  ,  Endo/Other  diabetes (borderline, no meds), Type 2Hypothyroidism   Renal/GU negative Renal ROS     Musculoskeletal   Abdominal   Peds  Hematology   Anesthesia Other Findings   Reproductive/Obstetrics                             Anesthesia Physical Anesthesia Plan  ASA: II  Anesthesia Plan: General   Post-op Pain Management:    Induction: Intravenous  PONV Risk Score and Plan: 4 or greater and Ondansetron, Dexamethasone, Midazolam, Diphenhydramine and Treatment may vary due to age or medical condition  Airway Management Planned: Oral ETT  Additional Equipment:   Intra-op Plan:   Post-operative Plan:   Informed Consent: I have reviewed the patients History and Physical, chart, labs and discussed the procedure including the risks, benefits and alternatives for the proposed anesthesia with the patient or authorized representative who has indicated his/her understanding and acceptance.     Plan Discussed with:   Anesthesia Plan Comments:         Anesthesia Quick Evaluation

## 2017-09-07 NOTE — Progress Notes (Signed)
Pt ready for TVH and bilateral salpingectomy   Labs reviewed . Neg HCG  All questions answered . Proceed

## 2017-09-07 NOTE — Brief Op Note (Signed)
09/07/2017  8:42 AM  PATIENT:  Madison Johnson  42 y.o. female  PRE-OPERATIVE DIAGNOSIS:  menorrhagia, failed ablation, dyspareunia, dysmenorrhea  POST-OPERATIVE DIAGNOSIS:  Same as above PROCEDURE:  Procedure(s): HYSTERECTOMY VAGINAL (N/A) BILATERAL SALPINGECTOMY (Bilateral)  SURGEON:  Surgeon(s) and Role:    * Kelsye Loomer, Gwen Her, MD - Primary    * Ward, Honor Loh, MD - Assisting  PHYSICIAN ASSISTANT: Carita Pian, PA student   ASSISTANTS: none   ANESTHESIA:   general  EBL:  50 mL   BLOOD ADMINISTERED:none  DRAINS: foley   LOCAL MEDICATIONS USED:  LIDOCAINE  and Amount: 10 ml  SPECIMEN:  Source of Specimen:  cervix , uterus , bilateral tubes  DISPOSITION OF SPECIMEN:  PATHOLOGY  COUNTS:  YES  TOURNIQUET:  * No tourniquets in log *  DICTATION: .Other Dictation: Dictation Number verbal  PLAN OF CARE: Admit for overnight observation  PATIENT DISPOSITION:  PACU - hemodynamically stable.   Delay start of Pharmacological VTE agent (>24hrs) due to surgical blood loss or risk of bleeding: not applicable

## 2017-09-07 NOTE — Transfer of Care (Signed)
Immediate Anesthesia Transfer of Care Note  Patient: Madison Johnson  Procedure(s) Performed: HYSTERECTOMY VAGINAL (N/A ) BILATERAL SALPINGECTOMY (Bilateral )  Patient Location: PACU  Anesthesia Type:General  Level of Consciousness: awake  Airway & Oxygen Therapy: Patient Spontanous Breathing and Patient connected to face mask oxygen  Post-op Assessment: Report given to RN and Post -op Vital signs reviewed and stable  Post vital signs: Reviewed and stable  Last Vitals:  Vitals:   09/07/17 0617 09/07/17 0901  BP: 113/73 131/84  Pulse: 86 100  Resp: 17 19  Temp: (!) 36.4 C 36.8 C  SpO2: 97% 96%    Last Pain:  Vitals:   09/07/17 0617  TempSrc: Oral  PainSc: 2          Complications: No apparent anesthesia complications

## 2017-09-07 NOTE — Anesthesia Postprocedure Evaluation (Signed)
Anesthesia Post Note  Patient: Madison Johnson  Procedure(s) Performed: HYSTERECTOMY VAGINAL (N/A ) BILATERAL SALPINGECTOMY (Bilateral )  Patient location during evaluation: PACU Anesthesia Type: General Level of consciousness: awake and alert Pain management: pain level controlled Vital Signs Assessment: post-procedure vital signs reviewed and stable Respiratory status: spontaneous breathing and respiratory function stable Cardiovascular status: stable Anesthetic complications: no     Last Vitals:  Vitals:   09/07/17 0917 09/07/17 0923  BP: 128/90   Pulse: 93   Resp: 15   Temp:    SpO2: 97% 97%    Last Pain:  Vitals:   09/07/17 0923  TempSrc:   PainSc: 4                  Marquitta Persichetti K

## 2017-09-07 NOTE — Anesthesia Procedure Notes (Signed)
Procedure Name: Intubation Date/Time: 09/07/2017 7:37 AM Performed by: Allean Found, CRNA Pre-anesthesia Checklist: Patient identified, Emergency Drugs available, Suction available, Patient being monitored and Timeout performed Patient Re-evaluated:Patient Re-evaluated prior to induction Oxygen Delivery Method: Circle system utilized Preoxygenation: Pre-oxygenation with 100% oxygen Induction Type: IV induction Ventilation: Mask ventilation without difficulty Laryngoscope Size: Mac and 3 Grade View: Grade II Tube type: Oral Number of attempts: 1 Airway Equipment and Method: Stylet Placement Confirmation: ETT inserted through vocal cords under direct vision,  positive ETCO2 and breath sounds checked- equal and bilateral Secured at: 22 cm Tube secured with: Tape Dental Injury: Teeth and Oropharynx as per pre-operative assessment

## 2017-09-07 NOTE — Op Note (Signed)
Madison Johnson, Madison Johnson NO.:  [id number]  MEDICAL RECORD NO.:  [id number]  LOCATION:                                 FACILITY:  PHYSICIAN:  Laverta Baltimore, MD     DATE OF BIRTH:  DATE OF PROCEDURE:  09/07/2017 DATE OF DISCHARGE:                              OPERATIVE REPORT   PREOPERATIVE DIAGNOSIS: 1. Menorrhagia, failed ablation. 2. Dyspareunia. 3. Dysmenorrhea.  POSTOPERATIVE DIAGNOSIS: 1. Menorrhagia, failed ablation. 2. Dyspareunia. 3. Dysmenorrhea.  PROCEDURE PERFORMED: 1. Total vaginal hysterectomy. 2. Bilateral salpingectomy.  SURGEON:  Laverta Baltimore, MD  ANESTHESIA:  General endotracheal anesthesia.  FIRST ASSISTANT:  Honor Loh Ward, MD.  SECOND ASSISTANT:  Carita Pian, PA student.  INDICATION:  This is a 42 year old, gravida 5, para 3, status post 3 vaginal deliveries and status post endometrial ablation in 2014.  The patient initially was doing well up to the ablation and then over the past year, bleeding has worsened to where she is bleeding 10-15 days per month.  The patient has postcoital spotting as well and dyspareunia which has caused her to decrease her coital activity due to pain and bleeding.  DESCRIPTION OF PROCEDURE:  After adequate general endotracheal anesthesia, the patient was placed in dorsal supine position, legs in the candy-cane stirrups.  Perineum and vagina were prepped and draped in a normal sterile fashion.  The patient did previously receive 2 g of IV Ancef prior to commencement of the case.  Time-out was performed. Straight catheterization of the bladder yielded 150 mL of clear urine. A weighted speculum was placed in the posterior vaginal vault and the anterior cervix was elevated with a Deaver retractor.  Cervix was grasped with 2 thyroid tenacula and the cervix was circumferentially injected with 1% lidocaine with 1:100,000 epinephrine.  A direct posterior colpotomy incision was made upon  entry into the posterior cul- de-sac.  A long-billed weighted speculum was placed.  The uterosacral ligaments were bilaterally clamped, transected, suture ligated with 0 Vicryl suture, tagged for later identification.  Cardinal ligaments were bilaterally clamped, transected, and suture ligated with 0 Vicryl suture.  The anterior cervix was incised with the Bovie and the anterior cul-de-sac was entered sharply without difficulty.  Deaver retractor was placed within the anterior cul-de-sac to elevate the bladder anteriorly. Uterine arteries were bilaterally clamped, transected, and suture ligated with 0 Vicryl suture.  Sequential bites were used on the lateral aspect of the uterus.  Ultimately, the cornua were bilaterally clamped, transected, and doubly ligated with 0 Vicryl suture.  Uterus and cervix were delivered.  Each fallopian tube was identified with a Babcock clamp and the fallopian tube was clamped and removed and ligated with 0 Vicryl suture.  This was done on both sides.  Ovaries appeared normal. Bleeding was well controlled.  The peritoneum was then closed with a pursestring 2-0 PDS stitch, and the cuff was then closed with a running 0 Vicryl suture.  The uterosacral ligaments were plicated centrally and the rest of vaginal cuff was closed with 0 Vicryl suture.  Good hemostasis was noted.  The Foley catheter was placed at the end of the case yielding clear urine additional 50  mL.  There were no complications.  ESTIMATED BLOOD LOSS:  50 mL.  INTRAOPERATIVE FLUID:  600 mL.  URINE OUTPUT:  200 mL.  The patient was taken to the recovery room in good condition.          ______________________________ Laverta Baltimore, MD     TS/MEDQ  D:  09/07/2017  T:  09/07/2017  Job:  [id number]

## 2017-09-08 ENCOUNTER — Encounter: Payer: Self-pay | Admitting: Obstetrics and Gynecology

## 2017-09-08 DIAGNOSIS — N920 Excessive and frequent menstruation with regular cycle: Secondary | ICD-10-CM | POA: Diagnosis not present

## 2017-09-08 LAB — CBC
HCT: 38.2 % (ref 35.0–47.0)
Hemoglobin: 13.2 g/dL (ref 12.0–16.0)
MCH: 29.4 pg (ref 26.0–34.0)
MCHC: 34.6 g/dL (ref 32.0–36.0)
MCV: 84.8 fL (ref 80.0–100.0)
PLATELETS: 284 10*3/uL (ref 150–440)
RBC: 4.51 MIL/uL (ref 3.80–5.20)
RDW: 13.8 % (ref 11.5–14.5)
WBC: 10.8 10*3/uL (ref 3.6–11.0)

## 2017-09-08 LAB — BASIC METABOLIC PANEL
Anion gap: 9 (ref 5–15)
BUN: 10 mg/dL (ref 6–20)
CHLORIDE: 107 mmol/L (ref 101–111)
CO2: 22 mmol/L (ref 22–32)
CREATININE: 0.61 mg/dL (ref 0.44–1.00)
Calcium: 8.5 mg/dL — ABNORMAL LOW (ref 8.9–10.3)
Glucose, Bld: 102 mg/dL — ABNORMAL HIGH (ref 65–99)
Potassium: 3.3 mmol/L — ABNORMAL LOW (ref 3.5–5.1)
SODIUM: 138 mmol/L (ref 135–145)

## 2017-09-08 MED ORDER — ONDANSETRON HCL 4 MG PO TABS: 4.0000 mg | ORAL_TABLET | Freq: Four times a day (QID) | ORAL | 0 refills | Status: DC | PRN

## 2017-09-08 MED ORDER — OXYCODONE-ACETAMINOPHEN 5-325 MG PO TABS: 1.0000 | ORAL_TABLET | ORAL | 0 refills | Status: DC | PRN

## 2017-09-08 MED ORDER — DOCUSATE SODIUM 100 MG PO CAPS: 100.0000 mg | ORAL_CAPSULE | Freq: Every day | ORAL | 2 refills | Status: DC | PRN

## 2017-09-08 MED ORDER — IBUPROFEN 800 MG PO TABS: 800.0000 mg | ORAL_TABLET | Freq: Three times a day (TID) | ORAL | 0 refills | Status: DC | PRN

## 2017-09-08 NOTE — Discharge Summary (Signed)
Physician Discharge Summary  Patient ID: Madison Johnson MRN: [id number] DOB/AGE: 08/16/1975 42 y.o.  Admit date: 09/07/2017 Discharge date: 09/08/2017  Admission Diagnoses:menorrhagia, dyspareunia  Discharge Diagnoses: same Active Problems:   Postoperative state   Discharged Condition: good  Hospital Course: pt underwent an uncomplicated TVH and bilateral salpingectomy . Post op did well   Consults: None  Significant Diagnostic Studies: labs:  Results for orders placed or performed during the hospital encounter of 09/07/17 (from the past 48 hour(s))  Pregnancy, urine POC     Status: None   Collection Time: 09/07/17  6:45 AM  Result Value Ref Range   Preg Test, Ur NEGATIVE NEGATIVE    Comment:        THE SENSITIVITY OF THIS METHODOLOGY IS >24 mIU/mL   ABO/Rh     Status: None   Collection Time: 09/07/17  7:08 AM  Result Value Ref Range   ABO/RH(D)      O POS Performed at [facility], [address] [place], [place] [address]   Glucose, capillary     Status: Abnormal   Collection Time: 09/07/17  9:12 AM  Result Value Ref Range   Glucose-Capillary 118 (H) 65 - 99 mg/dL  CBC     Status: None   Collection Time: 09/08/17  7:10 AM  Result Value Ref Range   WBC 10.8 3.6 - 11.0 K/uL   RBC 4.51 3.80 - 5.20 MIL/uL   Hemoglobin 13.2 12.0 - 16.0 g/dL   HCT 38.2 35.0 - 47.0 %   MCV 84.8 80.0 - 100.0 fL   MCH 29.4 26.0 - 34.0 pg   MCHC 34.6 32.0 - 36.0 g/dL   RDW 13.8 11.5 - 14.5 %   Platelets 284 150 - 440 K/uL    Comment: Performed at [facility], [address] [place], [place] [address]  Basic metabolic panel     Status: Abnormal   Collection Time: 09/08/17  7:10 AM  Result Value Ref Range   Sodium 138 135 - 145 mmol/L   Potassium 3.3 (L) 3.5 - 5.1 mmol/L   Chloride 107 101 - 111 mmol/L   CO2 22 22 - 32 mmol/L   Glucose, Bld 102 (H) 65 - 99 mg/dL   BUN 10 6 - 20 mg/dL   Creatinine, Ser 0.61 0.44 - 1.00 mg/dL   Calcium 8.5 (L) 8.9 - 10.3 mg/dL    GFR calc non Af Amer >60 >60 mL/min   GFR calc Af Amer >60 >60 mL/min    Comment: (NOTE) The eGFR has been calculated using the CKD EPI equation. This calculation has not been validated in all clinical situations. eGFR's persistently <60 mL/min signify possible Chronic Kidney Disease.    Anion gap 9 5 - 15    Comment: Performed at [facility], [address] [place], [place] [address]    Treatments: surgery as above Discharge Exam: Blood pressure 140/75, pulse 74, temperature 98.6 F (37 C), temperature source Oral, resp. rate 18, height 5' (1.524 m), weight 71.7 kg (158 lb), SpO2 96 %. General appearance: alert Head: Normocephalic, without obvious abnormality  Lungs CTA  CV RRR  Abd soft NT  Minimal blood on pad  Disposition: 01-Home or Self Care  Discharge Instructions    Call MD for:  difficulty breathing, headache or visual disturbances   Complete by:  As directed    Call MD for:  extreme fatigue   Complete by:  As directed    Call MD for:  hives   Complete by:  As directed    Call MD for:  persistant dizziness or light-headedness   Complete by:  As directed    Call MD for:  persistant nausea and vomiting   Complete by:  As directed    Call MD for:  redness, tenderness, or signs of infection (pain, swelling, redness, odor or green/yellow discharge around incision site)   Complete by:  As directed    Call MD for:  severe uncontrolled pain   Complete by:  As directed    Call MD for:  temperature >100.4   Complete by:  As directed    Diet - low sodium heart healthy   Complete by:  As directed    Increase activity slowly   Complete by:  As directed    Place in observation (patient's expected length of stay will be less than 2 midnights)   Complete by:  As directed      Allergies as of 09/08/2017      Reactions   Azithromycin Hives   Hives top to bottom,  they burn and turn into little blisters      Medication List    TAKE these medications    docusate sodium 100 MG capsule Commonly known as:  COLACE Take 1 capsule (100 mg total) by mouth daily as needed.   ibuprofen 800 MG tablet Commonly known as:  ADVIL,MOTRIN Take 1 tablet (800 mg total) by mouth every 8 (eight) hours as needed.   letrozole 2.5 MG tablet Commonly known as:  FEMARA Take 1 tablet (2.5 mg total) by mouth daily.   levothyroxine 25 MCG tablet Commonly known as:  SYNTHROID, LEVOTHROID TAKE 1 TAB DAILY ON EMPTY STOMACH WITH GLASS OF WATER AT LEAST 30-60 MIN BEFORE BREAKFAST   ondansetron 4 MG tablet Commonly known as:  ZOFRAN Take 1 tablet (4 mg total) by mouth every 6 (six) hours as needed for nausea.   oxyCODONE-acetaminophen 5-325 MG tablet Commonly known as:  PERCOCET/ROXICET Take 1-2 tablets by mouth every 4 (four) hours as needed for moderate pain ((when tolerating fluids)).   vitamin B-12 1000 MCG tablet Commonly known as:  CYANOCOBALAMIN Take 1,000 mcg by mouth daily.   vitamin C 500 MG tablet Commonly known as:  ASCORBIC ACID Take 500 mg by mouth daily.      Follow-up Information    Schermerhorn, Gwen Her, MD Follow up in 2 week(s).   Specialty:  Obstetrics and Gynecology Contact information: [address] [place] [address] [phone]           Signed: Gwen Her Schermerhorn 09/08/2017, 9:04 AM

## 2017-09-08 NOTE — Progress Notes (Signed)
Pt discharged home.  Discharge instructions, prescriptions and follow up appointment given to and reviewed with pt.  Pt verbalized understanding.  Escorted by auxillary. 

## 2017-09-10 LAB — SURGICAL PATHOLOGY

## 2017-09-16 NOTE — Progress Notes (Signed)
[facility]  [phone] [phone]  ID: Madison Johnson OB: 27-Dec-1975  MR#: [id number]  WER#:[id number]  Patient Care Team: Glendon Axe, MD as PCP - General (Internal Medicine)  CHIEF COMPLAINT: Multifocal right breast DCIS  INTERVAL HISTORY: Patient returns to clinic today for routine 6 month evaluation. She currently feels well and is asymptomatic.  She is tolerating letrozole well and denies any side effects.  She has no neurologic complaints. She denies any recent fevers or illnesses.  She denies any chest pain or shortness of breath.  She has no nausea, vomiting, constipation, or diarrhea.  She has a good appetite and denies any weight loss. She denies any pain.  She has no urinary complaints.  Patient offers no specific complaints today.  REVIEW OF SYSTEMS:   Review of Systems  Constitutional: Negative.  Negative for fever, malaise/fatigue and weight loss.  HENT: Negative.  Negative for congestion.   Respiratory: Negative.  Negative for cough and shortness of breath.   Cardiovascular: Negative.  Negative for chest pain and leg swelling.  Gastrointestinal: Negative.  Negative for abdominal pain, constipation, diarrhea, nausea and vomiting.  Genitourinary: Negative.  Negative for frequency and urgency.  Musculoskeletal: Negative.   Skin: Negative.  Negative for rash.  Neurological: Negative.  Negative for dizziness, tingling, sensory change, weakness and headaches.  Psychiatric/Behavioral: Negative.  The patient is not nervous/anxious.     As per HPI. Otherwise, a complete review of systems is negative.  PAST MEDICAL HISTORY: Past Medical History:  Diagnosis Date  . Cancer (HCC)    BREAST  . Diabetes mellitus without complication ([place]) 1950   BORDERLINE PER PT-MD TOLD PT TO EXERCISE AND DIET-NO MEDS CURRENTLY  . GERD (gastroesophageal reflux disease)   . Hypothyroidism   . Nonalcoholic steatohepatitis     PAST SURGICAL  HISTORY: Past Surgical History:  Procedure Laterality Date  . BILATERAL SALPINGECTOMY Bilateral 09/07/2017   Procedure: BILATERAL SALPINGECTOMY;  Surgeon: Schermerhorn, Gwen Her, MD;  Location: [facility] ORS;  Service: Gynecology;  Laterality: Bilateral;  . BREAST BIOPSY Right 12/02/2015   axillary nodes removed  . BREAST BIOPSY Left 12/02/2015   neg  . EYE SURGERY Left 2009   GROWTH REMOVED  . MASTECTOMY Right   . MASTECTOMY W/ SENTINEL NODE BIOPSY Right 01/20/2016   Procedure: MASTECTOMY WITH SENTINEL LYMPH NODE BIOPSY;  Surgeon: Leonie Green, MD;  Location: [facility] ORS;  Service: General;  Laterality: Right;  . SENTINEL NODE BIOPSY Right 01/20/2016   Procedure: SENTINEL NODE BIOPSY;  Surgeon: Leonie Green, MD;  Location: [facility] ORS;  Service: General;  Laterality: Right;  . TUBAL LIGATION    . VAGINAL HYSTERECTOMY N/A 09/07/2017   Procedure: HYSTERECTOMY VAGINAL;  Surgeon: Schermerhorn, Gwen Her, MD;  Location: [facility] ORS;  Service: Gynecology;  Laterality: N/A;    FAMILY HISTORY: Grandfather and maternal aunt with stomach cancer.     ADVANCED DIRECTIVES:    HEALTH MAINTENANCE: Social History   Tobacco Use  . Smoking status: Never Smoker  . Smokeless tobacco: Never Used  Substance Use Topics  . Alcohol use: Yes    Comment: OCC  . Drug use: No     Colonoscopy:  PAP:  Bone density:  Lipid panel:  Allergies  Allergen Reactions  . Azithromycin Hives    Hives top to bottom,  they burn and turn into little blisters    Current Outpatient Medications  Medication Sig Dispense Refill  . ibuprofen (ADVIL,MOTRIN) 800 MG tablet Take 1 tablet (800 mg  total) by mouth every 8 (eight) hours as needed. 30 tablet 0  . letrozole (FEMARA) 2.5 MG tablet Take 1 tablet (2.5 mg total) by mouth daily. 90 tablet 3  . levothyroxine (SYNTHROID, LEVOTHROID) 25 MCG tablet TAKE 1 TAB DAILY ON EMPTY STOMACH WITH GLASS OF WATER AT LEAST 30-60 MIN BEFORE BREAKFAST  1  . docusate sodium (COLACE)  100 MG capsule Take 1 capsule (100 mg total) by mouth daily as needed. (Patient not taking: Reported on 09/17/2017) 30 capsule 2  . ondansetron (ZOFRAN) 4 MG tablet Take 1 tablet (4 mg total) by mouth every 6 (six) hours as needed for nausea. (Patient not taking: Reported on 09/17/2017) 20 tablet 0  . oxyCODONE-acetaminophen (PERCOCET/ROXICET) 5-325 MG tablet Take 1-2 tablets by mouth every 4 (four) hours as needed for moderate pain ((when tolerating fluids)). (Patient not taking: Reported on 09/17/2017) 30 tablet 0  . vitamin B-12 (CYANOCOBALAMIN) 1000 MCG tablet Take 1,000 mcg by mouth daily.    . vitamin C (ASCORBIC ACID) 500 MG tablet Take 500 mg by mouth daily.     No current facility-administered medications for this visit.     OBJECTIVE: Vitals:   09/17/17 1113  BP: 119/82  Pulse: 71  Temp: 98 F (36.7 C)     Body mass index is 30.72 kg/m.    ECOG FS:0 - Asymptomatic  General: Well-developed, well-nourished, no acute distress. Eyes: Pink conjunctiva, anicteric sclera. Breasts: Well healing right mastectomy scar. Lungs: Clear to auscultation bilaterally. Heart: Regular rate and rhythm. No rubs, murmurs, or gallops. Abdomen: Soft, nontender, nondistended. No organomegaly noted, normoactive bowel sounds. Musculoskeletal: No edema, cyanosis, or clubbing. Neuro: Alert, answering all questions appropriately. Cranial nerves grossly intact. Skin: No rashes or petechiae noted. Psych: Normal affect.  LAB RESULTS:  Lab Results  Component Value Date   NA 138 09/08/2017   K 3.3 (L) 09/08/2017   CL 107 09/08/2017   CO2 22 09/08/2017   GLUCOSE 102 (H) 09/08/2017   BUN 10 09/08/2017   CREATININE 0.61 09/08/2017   CALCIUM 8.5 (L) 09/08/2017   GFRNONAA >60 09/08/2017   GFRAA >60 09/08/2017    Lab Results  Component Value Date   WBC 10.8 09/08/2017   HGB 13.2 09/08/2017   HCT 38.2 09/08/2017   MCV 84.8 09/08/2017   PLT 284 09/08/2017     STUDIES: No results  found.  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  PLAN:    1. Multifocal right breast DCIS: Final pathology did not reveal an invasive component, therefore patient did not require chemotherapy.  Because she had a total mastectomy, she did not require adjuvant XRT. Patient has not yet seen a Psychiatric nurse, but will reconsider this option in the future. Tamoxifen was discontinued secondary to side effects. She was started on letrozole in November 2017 and has been tolerating well with no significant side effects.  Continue hormonal treatment for total 5 years completing in July 2022.  Her most recent screening left mammogram on May 29, 2017 was reported as BI-RADS 2.  Repeat in November 2019.  Return to clinic in 6 months for routine evaluation.  2. Genetic testing: Patient is BCRA 1 and 2 negative. 3.  Bone health: Bone marrow density on September 06, 2016 reported T score of -0.3 which is considered normal.  Repeat in February 2020.  Approximately 20 minutes was spent in discussion of which greater than 50% was consultation.   Lloyd Huger, MD 09/17/17 12:49 PM

## 2017-09-17 ENCOUNTER — Other Ambulatory Visit: Payer: Self-pay

## 2017-09-17 ENCOUNTER — Inpatient Hospital Stay: Payer: 59 | Attending: Oncology | Admitting: Oncology

## 2017-09-17 VITALS — BP 119/82 | HR 71 | Temp 98.0°F | Ht 60.0 in | Wt 157.3 lb

## 2017-09-17 DIAGNOSIS — E119 Type 2 diabetes mellitus without complications: Secondary | ICD-10-CM | POA: Diagnosis not present

## 2017-09-17 DIAGNOSIS — Z79899 Other long term (current) drug therapy: Secondary | ICD-10-CM | POA: Diagnosis not present

## 2017-09-17 DIAGNOSIS — Z79811 Long term (current) use of aromatase inhibitors: Secondary | ICD-10-CM

## 2017-09-17 DIAGNOSIS — D0511 Intraductal carcinoma in situ of right breast: Secondary | ICD-10-CM

## 2017-09-17 NOTE — Progress Notes (Signed)
Here for follow up

## 2017-10-05 DIAGNOSIS — L929 Granulomatous disorder of the skin and subcutaneous tissue, unspecified: Secondary | ICD-10-CM | POA: Diagnosis not present

## 2017-10-05 DIAGNOSIS — E039 Hypothyroidism, unspecified: Secondary | ICD-10-CM | POA: Diagnosis not present

## 2017-10-08 DIAGNOSIS — H109 Unspecified conjunctivitis: Secondary | ICD-10-CM | POA: Diagnosis not present

## 2017-10-16 DIAGNOSIS — E039 Hypothyroidism, unspecified: Secondary | ICD-10-CM | POA: Diagnosis not present

## 2017-10-16 DIAGNOSIS — K7581 Nonalcoholic steatohepatitis (NASH): Secondary | ICD-10-CM | POA: Diagnosis not present

## 2017-10-16 DIAGNOSIS — K219 Gastro-esophageal reflux disease without esophagitis: Secondary | ICD-10-CM | POA: Diagnosis not present

## 2017-12-11 DIAGNOSIS — M5418 Radiculopathy, sacral and sacrococcygeal region: Secondary | ICD-10-CM | POA: Diagnosis not present

## 2017-12-11 DIAGNOSIS — M5417 Radiculopathy, lumbosacral region: Secondary | ICD-10-CM | POA: Diagnosis not present

## 2017-12-11 DIAGNOSIS — M545 Low back pain: Secondary | ICD-10-CM | POA: Diagnosis not present

## 2017-12-13 DIAGNOSIS — M545 Low back pain: Secondary | ICD-10-CM | POA: Diagnosis not present

## 2017-12-13 DIAGNOSIS — M5417 Radiculopathy, lumbosacral region: Secondary | ICD-10-CM | POA: Diagnosis not present

## 2017-12-13 DIAGNOSIS — M5418 Radiculopathy, sacral and sacrococcygeal region: Secondary | ICD-10-CM | POA: Diagnosis not present

## 2017-12-18 DIAGNOSIS — M5418 Radiculopathy, sacral and sacrococcygeal region: Secondary | ICD-10-CM | POA: Diagnosis not present

## 2017-12-18 DIAGNOSIS — M5417 Radiculopathy, lumbosacral region: Secondary | ICD-10-CM | POA: Diagnosis not present

## 2017-12-18 DIAGNOSIS — M545 Low back pain: Secondary | ICD-10-CM | POA: Diagnosis not present

## 2017-12-19 DIAGNOSIS — M545 Low back pain: Secondary | ICD-10-CM | POA: Diagnosis not present

## 2017-12-19 DIAGNOSIS — M5417 Radiculopathy, lumbosacral region: Secondary | ICD-10-CM | POA: Diagnosis not present

## 2017-12-19 DIAGNOSIS — M5418 Radiculopathy, sacral and sacrococcygeal region: Secondary | ICD-10-CM | POA: Diagnosis not present

## 2017-12-21 DIAGNOSIS — M5417 Radiculopathy, lumbosacral region: Secondary | ICD-10-CM | POA: Diagnosis not present

## 2017-12-21 DIAGNOSIS — M545 Low back pain: Secondary | ICD-10-CM | POA: Diagnosis not present

## 2017-12-21 DIAGNOSIS — M5418 Radiculopathy, sacral and sacrococcygeal region: Secondary | ICD-10-CM | POA: Diagnosis not present

## 2017-12-26 DIAGNOSIS — M5418 Radiculopathy, sacral and sacrococcygeal region: Secondary | ICD-10-CM | POA: Diagnosis not present

## 2017-12-26 DIAGNOSIS — M545 Low back pain: Secondary | ICD-10-CM | POA: Diagnosis not present

## 2017-12-26 DIAGNOSIS — M5417 Radiculopathy, lumbosacral region: Secondary | ICD-10-CM | POA: Diagnosis not present

## 2017-12-28 DIAGNOSIS — M5417 Radiculopathy, lumbosacral region: Secondary | ICD-10-CM | POA: Diagnosis not present

## 2017-12-28 DIAGNOSIS — M5418 Radiculopathy, sacral and sacrococcygeal region: Secondary | ICD-10-CM | POA: Diagnosis not present

## 2017-12-28 DIAGNOSIS — M545 Low back pain: Secondary | ICD-10-CM | POA: Diagnosis not present

## 2017-12-31 DIAGNOSIS — M545 Low back pain: Secondary | ICD-10-CM | POA: Diagnosis not present

## 2017-12-31 DIAGNOSIS — M5418 Radiculopathy, sacral and sacrococcygeal region: Secondary | ICD-10-CM | POA: Diagnosis not present

## 2017-12-31 DIAGNOSIS — M5417 Radiculopathy, lumbosacral region: Secondary | ICD-10-CM | POA: Diagnosis not present

## 2018-01-07 DIAGNOSIS — E039 Hypothyroidism, unspecified: Secondary | ICD-10-CM | POA: Diagnosis not present

## 2018-01-15 DIAGNOSIS — M545 Low back pain: Secondary | ICD-10-CM | POA: Diagnosis not present

## 2018-01-15 DIAGNOSIS — M5418 Radiculopathy, sacral and sacrococcygeal region: Secondary | ICD-10-CM | POA: Diagnosis not present

## 2018-01-15 DIAGNOSIS — M5417 Radiculopathy, lumbosacral region: Secondary | ICD-10-CM | POA: Diagnosis not present

## 2018-01-17 ENCOUNTER — Other Ambulatory Visit: Payer: Self-pay | Admitting: Internal Medicine

## 2018-01-17 DIAGNOSIS — Z1231 Encounter for screening mammogram for malignant neoplasm of breast: Secondary | ICD-10-CM

## 2018-01-22 DIAGNOSIS — Z0000 Encounter for general adult medical examination without abnormal findings: Secondary | ICD-10-CM | POA: Diagnosis not present

## 2018-01-22 DIAGNOSIS — M545 Low back pain: Secondary | ICD-10-CM | POA: Diagnosis not present

## 2018-01-22 DIAGNOSIS — M5416 Radiculopathy, lumbar region: Secondary | ICD-10-CM | POA: Diagnosis not present

## 2018-01-22 DIAGNOSIS — M778 Other enthesopathies, not elsewhere classified: Secondary | ICD-10-CM | POA: Diagnosis not present

## 2018-01-30 ENCOUNTER — Ambulatory Visit
Admission: RE | Admit: 2018-01-30 | Discharge: 2018-01-30 | Disposition: A | Discharge status: Home / Self Care | Payer: 59 | Source: Ambulatory Visit | Attending: Internal Medicine | Admitting: Internal Medicine

## 2018-01-30 DIAGNOSIS — Z1231 Encounter for screening mammogram for malignant neoplasm of breast: Secondary | ICD-10-CM | POA: Insufficient documentation

## 2018-01-31 DIAGNOSIS — M7711 Lateral epicondylitis, right elbow: Secondary | ICD-10-CM | POA: Diagnosis not present

## 2018-01-31 DIAGNOSIS — M25521 Pain in right elbow: Secondary | ICD-10-CM | POA: Diagnosis not present

## 2018-02-12 IMAGING — MG MM BREAST BX W/ LOC DEV EA AD LESION IMAG BX SPEC STEREO GUIDE*R
1 series · 8 of 8 positions shown · non-contrast
Comparison: Previous exams.
COMPARISON: Previous exams.

ADDENDUM:
Pathology of the lower outer right breast biopsy revealed BREAST
CALCIFICATIONS, RIGHT LOWER OUTER QUADRANT; STEREOTACTIC BIOPSY:
HIGH-GRADE COMEDO DUCTAL CARCINOMA IN SITU (DCIS) WITH
CALCIFICATION. These findings were communicated to Andru in Dr.
[REDACTED] on 12/03/2015. This was found to be concordant with
Dr. Bernal impression and notes.

Recommendations:  Surgical and oncology referral.
The patient was contacted by Justitia on 12/07/15 for a post
biopsy check. She stated she did well following the biopsy with no
bleeding, bruising, or hematoma. All of her questions were answered.
She was encouraged to call the [REDACTED]
stated she had been notified of the results by her physician on
[REDACTED], December 03, 2015. She has an appointment with Dr. Funderburk, Mague,
oncologist at [HOSPITAL] [HOSPITAL] on [REDACTED], 12/14/15 at
Addendum by Abel on 12/07/15.
CLINICAL DATA: Suspicious right breast calcifications. Biopsy of 2
areas of calcifications within the right breast was recommended.
EXAM:
RIGHT BREAST STEREOTACTIC CORE NEEDLE BIOPSY

[Series 1: R · right · 0.10mm/px · 8 of 11 slices shown]
[im 1/11]
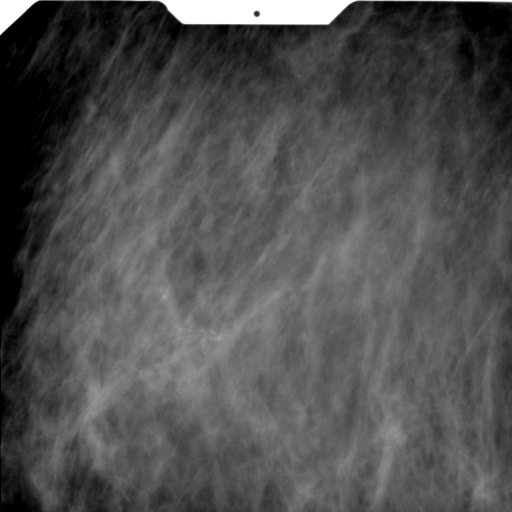
[im 2/11]
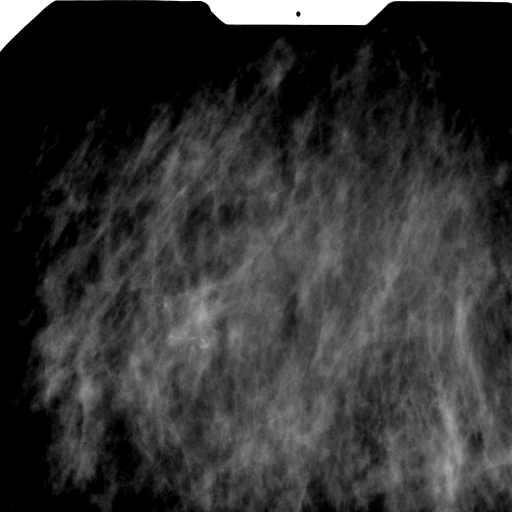
[im 3/11]
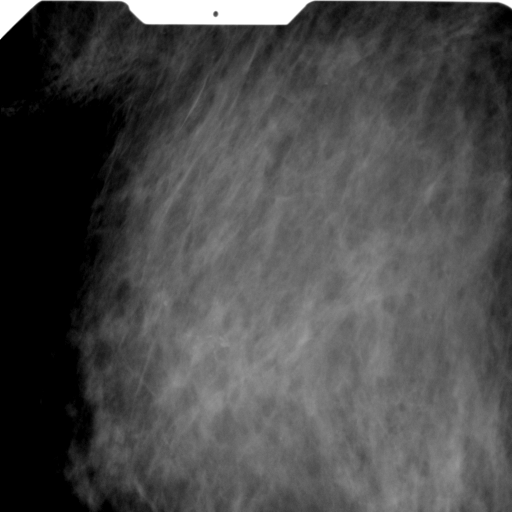
[im 5/11]
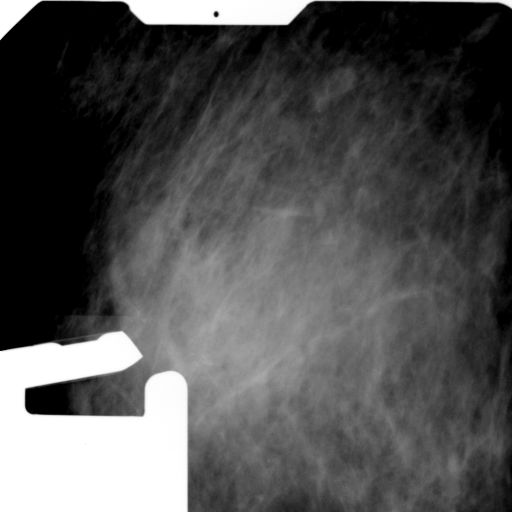
[im 6/11]
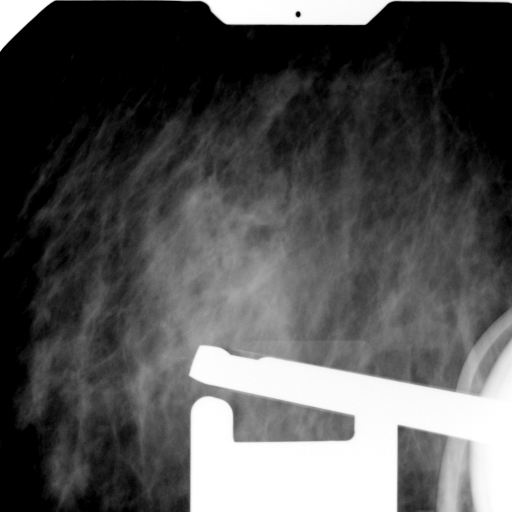
[im 8/11]
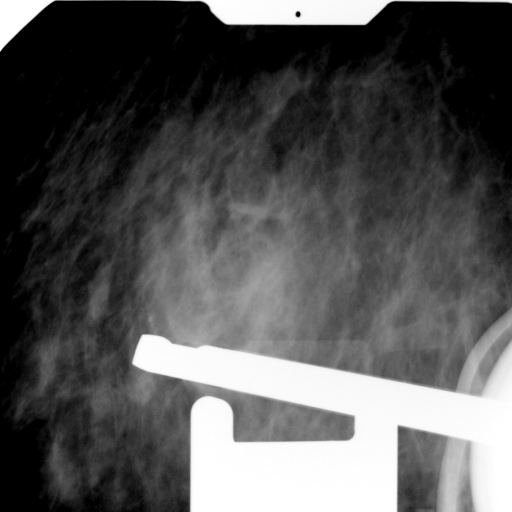
[im 9/11]
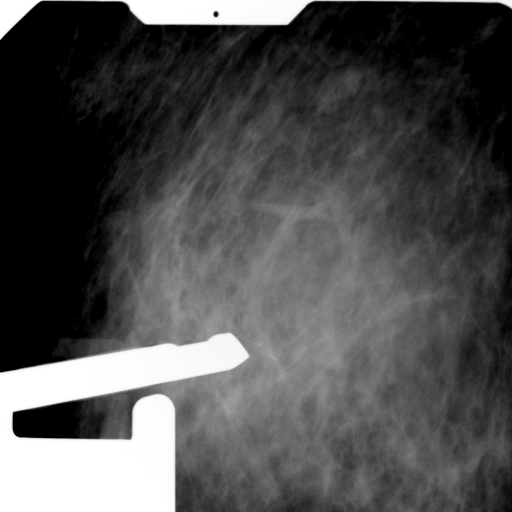
[im 11/11]
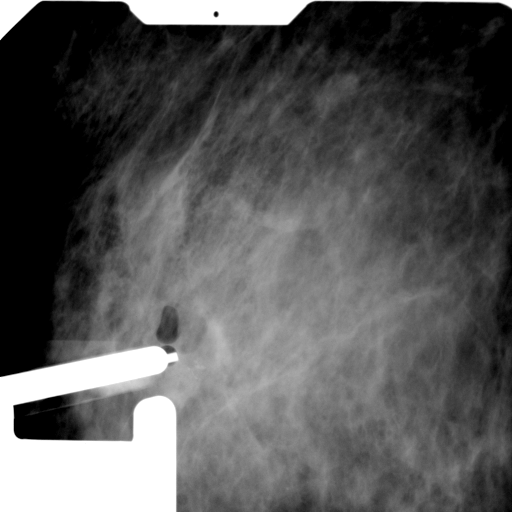

[8 of 8 positions shown; findings below may reference images not displayed]



Using sterile technique and 1% Lidocaine as local anesthetic, under
stereotactic guidance, a 9 gauge vacuum assist device was used to
perform core needle biopsy of calcifications in the lower, outer
quadrant of the right breast using a lateral to medial approach.
Specimen radiograph was performed showing calcifications within the
specimen. Specimens with calcifications are identified for
pathology.

At the conclusion of the procedure, a cylinder-shaped tissue marker
clip was deployed into the biopsy cavity. Follow-up 2-view mammogram
was performed and dictated separately.
IMPRESSION: Stereotactic-guided biopsy of calcifications within the lower, outer
right breast. No apparent complications.

ADDENDUM:
Pathology of the lower outer right breast biopsy revealed BREAST
CALCIFICATIONS, RIGHT LOWER OUTER QUADRANT; STEREOTACTIC BIOPSY:
HIGH-GRADE COMEDO DUCTAL CARCINOMA IN SITU (DCIS) WITH
CALCIFICATION. These findings were communicated to [place] in Dr.
[REDACTED] on 12/03/2015. This was found to be concordant with
Asamani. Nicasio impression and notes.

Recommendations:  Surgical and oncology referral.

The patient was contacted by Idas on 12/07/15 for a post
biopsy check. She stated she did well following the biopsy with no
bleeding, bruising, or hematoma. All of her questions were answered.
She was encouraged to call the [REDACTED]
stated she had been notified of the results by her physician on
[REDACTED], December 03, 2015. She has an appointment with Asamani. Hicks, Tasia,
oncologist at [HOSPITAL] [HOSPITAL] on [REDACTED], 12/14/15 at

Addendum by Nielsen on 12/07/15.
FINDINGS: The patient and I discussed the procedure of stereotactic-guided
biopsy including benefits and alternatives. We discussed the high
likelihood of a successful procedure. We discussed the risks of the
procedure including infection, bleeding, tissue injury, clip
migration, and inadequate sampling. Informed written consent was
given. The usual time out protocol was performed immediately prior
to the procedure.

Using sterile technique and 1% Lidocaine as local anesthetic, under
stereotactic guidance, a 9 gauge vacuum assist device was used to
perform core needle biopsy of calcifications in the lower, outer
quadrant of the right breast using a lateral to medial approach.
Specimen radiograph was performed showing calcifications within the
specimen. Specimens with calcifications are identified for
pathology.

At the conclusion of the procedure, a cylinder-shaped tissue marker
clip was deployed into the biopsy cavity. Follow-up 2-view mammogram
was performed and dictated separately.
IMPRESSION: Stereotactic-guided biopsy of calcifications within the lower, outer
right breast. No apparent complications.

## 2018-02-13 LAB — HIV ANTIBODY (ROUTINE TESTING W REFLEX): HIV Screen 4th Generation wRfx: NONREACTIVE

## 2018-02-28 DIAGNOSIS — M25521 Pain in right elbow: Secondary | ICD-10-CM | POA: Diagnosis not present

## 2018-02-28 DIAGNOSIS — M7711 Lateral epicondylitis, right elbow: Secondary | ICD-10-CM | POA: Diagnosis not present

## 2018-04-11 DIAGNOSIS — M7711 Lateral epicondylitis, right elbow: Secondary | ICD-10-CM | POA: Diagnosis not present

## 2018-04-11 DIAGNOSIS — M25521 Pain in right elbow: Secondary | ICD-10-CM | POA: Diagnosis not present

## 2018-04-14 NOTE — Progress Notes (Signed)
[name]  [phone] [phone]  ID: Madison Johnson OB: 1975-10-09  MR#: [id number]  YTK#:[id number]  Patient Care Team: Glendon Axe, MD as PCP - General (Internal Medicine)  CHIEF COMPLAINT: Multifocal right breast DCIS  INTERVAL HISTORY: Patient returns to clinic today for routine six-month evaluation.  She continues to tolerate letrozole well without significant side effects.  She currently feels well and is asymptomatic. She has no neurologic complaints. She denies any recent fevers or illnesses.  She denies any chest pain or shortness of breath.  She has no nausea, vomiting, constipation, or diarrhea.  She has a good appetite and denies any weight loss. She denies any pain.  She has no urinary complaints.  Patient feels at her baseline offers no specific complaints today.  REVIEW OF SYSTEMS:   Review of Systems  Constitutional: Negative.  Negative for fever, malaise/fatigue and weight loss.  HENT: Negative.  Negative for congestion.   Respiratory: Negative.  Negative for cough and shortness of breath.   Cardiovascular: Negative.  Negative for chest pain and leg swelling.  Gastrointestinal: Negative.  Negative for abdominal pain, constipation, diarrhea, nausea and vomiting.  Genitourinary: Negative.  Negative for frequency and urgency.  Musculoskeletal: Negative.  Negative for myalgias.  Skin: Negative.  Negative for rash.  Neurological: Negative.  Negative for dizziness, tingling, sensory change, weakness and headaches.  Psychiatric/Behavioral: Negative.  The patient is not nervous/anxious.     As per HPI. Otherwise, a complete review of systems is negative.  PAST MEDICAL HISTORY: Past Medical History:  Diagnosis Date  . Cancer (HCC)    BREAST  . Diabetes mellitus without complication ([place]) 5573   BORDERLINE PER PT-MD TOLD PT TO EXERCISE AND DIET-NO MEDS CURRENTLY  . GERD (gastroesophageal reflux disease)   . Hypothyroidism   .  Nonalcoholic steatohepatitis     PAST SURGICAL HISTORY: Past Surgical History:  Procedure Laterality Date  . BILATERAL SALPINGECTOMY Bilateral 09/07/2017   Procedure: BILATERAL SALPINGECTOMY;  Surgeon: Schermerhorn, Gwen Her, MD;  Location: [facility];  Service: Gynecology;  Laterality: Bilateral;  . BREAST BIOPSY Right 12/02/2015   axillary nodes removed  . BREAST BIOPSY Left 12/02/2015   neg  . EYE SURGERY Left 2009   GROWTH REMOVED  . MASTECTOMY Right   . MASTECTOMY W/ SENTINEL NODE BIOPSY Right 01/20/2016   Procedure: MASTECTOMY WITH SENTINEL LYMPH NODE BIOPSY;  Surgeon: Leonie Green, MD;  Location: [facility];  Service: General;  Laterality: Right;  . SENTINEL NODE BIOPSY Right 01/20/2016   Procedure: SENTINEL NODE BIOPSY;  Surgeon: Leonie Green, MD;  Location: [facility];  Service: General;  Laterality: Right;  . TUBAL LIGATION    . VAGINAL HYSTERECTOMY N/A 09/07/2017   Procedure: HYSTERECTOMY VAGINAL;  Surgeon: Schermerhorn, Gwen Her, MD;  Location: [facility];  Service: Gynecology;  Laterality: N/A;    FAMILY HISTORY: Grandfather and maternal aunt with stomach cancer.     ADVANCED DIRECTIVES:    HEALTH MAINTENANCE: Social History   Tobacco Use  . Smoking status: Never Smoker  . Smokeless tobacco: Never Used  Substance Use Topics  . Alcohol use: Yes    Comment: OCC  . Drug use: No     Colonoscopy:  PAP:  Bone density:  Lipid panel:  Allergies  Allergen Reactions  . Azithromycin Hives    Hives top to bottom,  they burn and turn into little blisters    Current Outpatient Medications  Medication Sig Dispense Refill  . gabapentin (NEURONTIN) 300 MG  capsule Take 300 mg by mouth at bedtime.     Marland Kitchen ibuprofen (ADVIL,MOTRIN) 800 MG tablet Take 1 tablet (800 mg total) by mouth every 8 (eight) hours as needed. 30 tablet 0  . letrozole (FEMARA) 2.5 MG tablet Take 1 tablet (2.5 mg total) by mouth daily. 90 tablet 3  . levothyroxine (SYNTHROID, LEVOTHROID) 25 MCG  tablet TAKE 1 TAB DAILY ON EMPTY STOMACH WITH GLASS OF WATER AT LEAST 30-60 MIN BEFORE BREAKFAST  1  . tiZANidine (ZANAFLEX) 2 MG tablet 1-2 tablets three times a day as needed for muscle spasm    . vitamin B-12 (CYANOCOBALAMIN) 1000 MCG tablet Take 1,000 mcg by mouth daily.    . vitamin C (ASCORBIC ACID) 500 MG tablet Take 500 mg by mouth daily.    . ondansetron (ZOFRAN) 4 MG tablet Take 1 tablet (4 mg total) by mouth every 6 (six) hours as needed for nausea. (Patient not taking: Reported on 04/15/2018) 20 tablet 0  . oxyCODONE-acetaminophen (PERCOCET/ROXICET) 5-325 MG tablet Take 1-2 tablets by mouth every 4 (four) hours as needed for moderate pain ((when tolerating fluids)). (Patient not taking: Reported on 04/15/2018) 30 tablet 0   No current facility-administered medications for this visit.     OBJECTIVE: Vitals:   04/15/18 1101  BP: 132/89  Pulse: 71  Resp: 18  Temp: (!) 97.4 F (36.3 C)     Body mass index is 32.05 kg/m.    ECOG FS:0 - Asymptomatic  General: Well-developed, well-nourished, no acute distress. Eyes: Pink conjunctiva, anicteric sclera. HEENT: Normocephalic, moist mucous membranes. Breast: Patient declined breast exam today. Lungs: Clear to auscultation bilaterally. Heart: Regular rate and rhythm. No rubs, murmurs, or gallops. Abdomen: Soft, nontender, nondistended. No organomegaly noted, normoactive bowel sounds. Musculoskeletal: No edema, cyanosis, or clubbing. Neuro: Alert, answering all questions appropriately. Cranial nerves grossly intact. Skin: No rashes or petechiae noted. Psych: Normal affect.  LAB RESULTS:  Lab Results  Component Value Date   NA 138 09/08/2017   K 3.3 (L) 09/08/2017   CL 107 09/08/2017   CO2 22 09/08/2017   GLUCOSE 102 (H) 09/08/2017   BUN 10 09/08/2017   CREATININE 0.61 09/08/2017   CALCIUM 8.5 (L) 09/08/2017   GFRNONAA >60 09/08/2017   GFRAA >60 09/08/2017    Lab Results  Component Value Date   WBC 10.8 09/08/2017    HGB 13.2 09/08/2017   HCT 38.2 09/08/2017   MCV 84.8 09/08/2017   PLT 284 09/08/2017     STUDIES: No results found.  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  PLAN:    1. Multifocal right breast DCIS: Final pathology did not reveal an invasive component, therefore patient did not require chemotherapy.  Because she had a total mastectomy, she did not require adjuvant XRT. Tamoxifen was discontinued secondary to side effects.  Patient was switched to letrozole and is tolerating this well.  Continue treatment for a total of 5 years completing in July 2022. Her most recent screening left mammogram on May 29, 2017 was reported as BI-RADS 2.  Repeat in November 2019.  Return to clinic in 6 months for routine evaluation. 2. Genetic testing: Patient is BCRA 1 and 2 negative. 3.  Bone health: Bone marrow density on September 06, 2016 reported T score of -0.3 which is considered normal.  Repeat in February 2020.  I spent a total of 20 minutes face-to-face with the patient of which greater than 50% of the visit was spent in counseling and coordination of  care as detailed above.    Lloyd Huger, MD 04/19/18 10:06 AM

## 2018-04-15 ENCOUNTER — Inpatient Hospital Stay: Payer: 59 | Attending: Oncology | Admitting: Oncology

## 2018-04-15 ENCOUNTER — Other Ambulatory Visit: Payer: Self-pay

## 2018-04-15 VITALS — BP 132/89 | HR 71 | Temp 97.4°F | Resp 18 | Wt 164.1 lb

## 2018-04-15 DIAGNOSIS — Z9011 Acquired absence of right breast and nipple: Secondary | ICD-10-CM | POA: Diagnosis not present

## 2018-04-15 DIAGNOSIS — Z79811 Long term (current) use of aromatase inhibitors: Secondary | ICD-10-CM

## 2018-04-15 DIAGNOSIS — D0511 Intraductal carcinoma in situ of right breast: Secondary | ICD-10-CM | POA: Diagnosis not present

## 2018-04-15 NOTE — Progress Notes (Signed)
Here for follow up. Per pt " I feel great " no voiced c/o

## 2018-04-18 DIAGNOSIS — H6591 Unspecified nonsuppurative otitis media, right ear: Secondary | ICD-10-CM | POA: Diagnosis not present

## 2018-04-18 DIAGNOSIS — J069 Acute upper respiratory infection, unspecified: Secondary | ICD-10-CM | POA: Diagnosis not present

## 2018-04-18 DIAGNOSIS — R6889 Other general symptoms and signs: Secondary | ICD-10-CM | POA: Diagnosis not present

## 2018-04-27 ENCOUNTER — Other Ambulatory Visit: Payer: Self-pay | Admitting: Oncology

## 2018-05-08 DIAGNOSIS — C801 Malignant (primary) neoplasm, unspecified: Secondary | ICD-10-CM | POA: Diagnosis not present

## 2018-05-14 DIAGNOSIS — R197 Diarrhea, unspecified: Secondary | ICD-10-CM | POA: Diagnosis not present

## 2018-07-26 ENCOUNTER — Ambulatory Visit (INDEPENDENT_AMBULATORY_CARE_PROVIDER_SITE_OTHER): Payer: 59 | Admitting: Plastic Surgery

## 2018-07-26 ENCOUNTER — Encounter: Payer: Self-pay | Admitting: Plastic Surgery

## 2018-07-26 VITALS — BP 129/73 | HR 93 | Temp 98.8°F | Ht 60.0 in | Wt 163.0 lb

## 2018-07-26 DIAGNOSIS — D0511 Intraductal carcinoma in situ of right breast: Secondary | ICD-10-CM

## 2018-07-26 NOTE — Progress Notes (Signed)
Patient ID: Madison Johnson, female    DOB: April 02, 1976, 43 y.o.   MRN: [id number]   Chief Complaint  Patient presents with  . Breast Problem    The patient is a 43 year old female here for consultation for breast reconstruction.  She is accompanied by her daughter.  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  She has not had any radiation.  She is on letrozole.   Not want to undergo reconstruction at the time.  She is now realizing is quite uncomfortable for the breast padding in her bra and is having trouble in place.  She is interested in reconstruction she is 5 feet tall and weighs 163 pounds.  Her preop bra size is a 40 D.  She would like to be around the same size or smaller the left side lifted for symmetry initially her thought was for construction fat and not having a silicone implant.  Gastroesophageal reflux and a fatty liver she is not a smoker and only drinks occasionally.  Her surgeries include: a D&C, tubal ligation and hysterectomy.   Review of Systems  Constitutional: Negative.  Negative for activity change and appetite change.  HENT: Negative.   Eyes: Negative.   Respiratory: Negative.  Negative for shortness of breath.   Cardiovascular: Negative.   Gastrointestinal: Negative.   Endocrine: Negative.   Genitourinary: Negative.   Musculoskeletal: Negative.   Skin: Negative.  Negative for color change and wound.  Psychiatric/Behavioral: Negative.     Past Medical History:  Diagnosis Date  . Cancer (HCC)    BREAST  . Diabetes mellitus without complication ([place]) 3545   BORDERLINE PER PT-MD TOLD PT TO EXERCISE AND DIET-NO MEDS CURRENTLY  . GERD (gastroesophageal reflux disease)   . Hypothyroidism   . Nonalcoholic steatohepatitis     Past Surgical History:  Procedure Laterality Date  . BILATERAL SALPINGECTOMY Bilateral 09/07/2017   Procedure: BILATERAL SALPINGECTOMY;  Surgeon:  Schermerhorn, Gwen Her, MD;  Location: [facility];  Service: Gynecology;  Laterality: Bilateral;  . BREAST BIOPSY Right 12/02/2015   axillary nodes removed  . BREAST BIOPSY Left 12/02/2015   neg  . EYE SURGERY Left 2009   GROWTH REMOVED  . MASTECTOMY Right   . MASTECTOMY W/ SENTINEL NODE BIOPSY Right 01/20/2016   Procedure: MASTECTOMY WITH SENTINEL LYMPH NODE BIOPSY;  Surgeon: Leonie Green, MD;  Location: [facility];  Service: General;  Laterality: Right;  . SENTINEL NODE BIOPSY Right 01/20/2016   Procedure: SENTINEL NODE BIOPSY;  Surgeon: Leonie Green, MD;  Location: [facility];  Service: General;  Laterality: Right;  . TUBAL LIGATION    . VAGINAL HYSTERECTOMY N/A 09/07/2017   Procedure: HYSTERECTOMY VAGINAL;  Surgeon: Schermerhorn, Gwen Her, MD;  Location: [facility];  Service: Gynecology;  Laterality: N/A;      Current Outpatient Medications:  .  gabapentin (NEURONTIN) 300 MG capsule, Take 300 mg by mouth at bedtime. , Disp: , Rfl:  .  ibuprofen (ADVIL,MOTRIN) 800 MG tablet, Take 1 tablet (800 mg total) by mouth every 8 (eight) hours as needed., Disp: 30 tablet, Rfl: 0 .  letrozole (FEMARA) 2.5 MG tablet, TAKE 1 TABLET BY MOUTH EVERY DAY, Disp: 90 tablet, Rfl: 2 .  levothyroxine (SYNTHROID, LEVOTHROID) 25 MCG tablet, TAKE 1 TAB DAILY ON EMPTY STOMACH WITH GLASS OF WATER AT LEAST 30-60 MIN BEFORE BREAKFAST, Disp: , Rfl: 1 .  ondansetron (ZOFRAN) 4 MG  tablet, Take 1 tablet (4 mg total) by mouth every 6 (six) hours as needed for nausea. (Patient not taking: Reported on 04/15/2018), Disp: 20 tablet, Rfl: 0 .  oxyCODONE-acetaminophen (PERCOCET/ROXICET) 5-325 MG tablet, Take 1-2 tablets by mouth every 4 (four) hours as needed for moderate pain ((when tolerating fluids)). (Patient not taking: Reported on 04/15/2018), Disp: 30 tablet, Rfl: 0 .  tiZANidine (ZANAFLEX) 2 MG tablet, 1-2 tablets three times a day as needed for muscle spasm, Disp: , Rfl:  .  vitamin B-12 (CYANOCOBALAMIN) 1000 MCG  tablet, Take 1,000 mcg by mouth daily., Disp: , Rfl:  .  vitamin C (ASCORBIC ACID) 500 MG tablet, Take 500 mg by mouth daily., Disp: , Rfl:    Objective:   There were no vitals filed for this visit.  Physical Exam Vitals signs and nursing note reviewed.  Constitutional:      Appearance: Normal appearance.  HENT:     Head: Normocephalic and atraumatic.     Nose: Nose normal.     Mouth/Throat:     Mouth: Mucous membranes are moist.  Eyes:     Pupils: Pupils are equal, round, and reactive to light.  Neck:     Musculoskeletal: Normal range of motion.  Cardiovascular:     Rate and Rhythm: Normal rate.     Pulses: Normal pulses.  Pulmonary:     Effort: Pulmonary effort is normal.  Abdominal:     General: Abdomen is flat. There is no distension.     Tenderness: There is no abdominal tenderness.  Skin:    General: Skin is warm.  Neurological:     General: No focal deficit present.     Mental Status: She is alert.  Psychiatric:        Mood and Affect: Mood normal.        Thought Content: Thought content normal.        Judgment: Judgment normal.     Assessment & Plan:  Ductal carcinoma in situ (DCIS) of right breast Assessment and Plan:  A long, detailed conversation was had regarding the patient's options for breast reconstruction. Five main points, which are explained to all breast reconstruction patients, were discussed.  1. Breast reconstruction is an optional process.  2. Breast reconstruction is a multi-stage process which involves multiple surgeries spaced several months apart. The entire process can take over one year.  3. The major goal of breast reconstruction is to have the patient look normal in clothing. When naked, there will always be scars.  4. Asymmetries are often present during the reconstruction process. Several operations may be needed, including surgery to the non-cancerous breast, to achieve satisfactory results.  5. No matter the reconstructive method,  there are ways that the reconstruction can fail and a secondary reconstructive plan would need to be created.   A general discussion regarding all available methods of breast reconstruction were discussed. The types of reconstructions described included.  1. Tissue expander and implant based reconstruction, both single and multi-stage approaches.  2. Autologous only reconstructions, including free abdominal-tissue based reconstructions.  3. Combination procedures, particularly latissismus dorsi flaps combined with either expanders or implants.  For each of the reconstruction methods mentioned above, the risks, benefits, alternatives, scarring, and recovery time were discussed in great detail. Specific risks detailed included bleeding, infection, hematoma, seroma, scarring, pain, wound healing complications, flap loss, fat necrosis, capsular contracture, need for implant removal, donor site complications, bulge, hernia, umbilical necrosis, need for urgent reoperation, and need for dressing  changes were discussed.   Assessment  Once all reconstruction options were presented, a focused discussion was had regarding the patient's suitability for each of these procedures.  A total of 50 minutes of face-to-face time was spent in this encounter, of which >50% was spent in counseling. She is interested in right breast reconstruction with expander / Flex HD placement.  This would be exchanged for an implant 3 months later. The left breast could be lifted and reduced to match at that time.  She states that she is very active and does not want the down time with flap surgery.   [place], DO

## 2018-07-30 DIAGNOSIS — M5416 Radiculopathy, lumbar region: Secondary | ICD-10-CM | POA: Insufficient documentation

## 2018-07-30 DIAGNOSIS — E039 Hypothyroidism, unspecified: Secondary | ICD-10-CM | POA: Diagnosis not present

## 2018-07-30 DIAGNOSIS — N83201 Unspecified ovarian cyst, right side: Secondary | ICD-10-CM | POA: Insufficient documentation

## 2018-08-20 ENCOUNTER — Other Ambulatory Visit: Payer: Self-pay | Admitting: Obstetrics and Gynecology

## 2018-08-20 DIAGNOSIS — N83202 Unspecified ovarian cyst, left side: Secondary | ICD-10-CM | POA: Diagnosis not present

## 2018-08-20 DIAGNOSIS — R102 Pelvic and perineal pain: Secondary | ICD-10-CM | POA: Diagnosis not present

## 2018-08-20 DIAGNOSIS — N83201 Unspecified ovarian cyst, right side: Secondary | ICD-10-CM | POA: Diagnosis not present

## 2018-09-09 ENCOUNTER — Ambulatory Visit
Admission: RE | Admit: 2018-09-09 | Discharge: 2018-09-09 | Disposition: A | Discharge status: Home / Self Care | Payer: 59 | Source: Ambulatory Visit | Attending: Oncology | Admitting: Oncology

## 2018-09-09 DIAGNOSIS — D0511 Intraductal carcinoma in situ of right breast: Secondary | ICD-10-CM

## 2018-09-09 DIAGNOSIS — Z780 Asymptomatic menopausal state: Secondary | ICD-10-CM | POA: Diagnosis not present

## 2018-09-09 DIAGNOSIS — Z13820 Encounter for screening for osteoporosis: Secondary | ICD-10-CM | POA: Diagnosis not present

## 2018-09-10 DIAGNOSIS — Z4431 Encounter for fitting and adjustment of external right breast prosthesis: Secondary | ICD-10-CM | POA: Diagnosis not present

## 2018-09-10 DIAGNOSIS — C50111 Malignant neoplasm of central portion of right female breast: Secondary | ICD-10-CM | POA: Diagnosis not present

## 2018-09-18 DIAGNOSIS — R6889 Other general symptoms and signs: Secondary | ICD-10-CM | POA: Diagnosis not present

## 2018-10-15 ENCOUNTER — Ambulatory Visit: Payer: 59 | Admitting: Oncology

## 2018-11-05 ENCOUNTER — Telehealth: Payer: Self-pay | Admitting: Oncology

## 2018-11-06 ENCOUNTER — Inpatient Hospital Stay: Payer: 59 | Admitting: Oncology

## 2018-11-06 ENCOUNTER — Other Ambulatory Visit: Payer: Self-pay

## 2018-11-06 NOTE — Progress Notes (Signed)
Telephone visit today for follow up on DCIS. Patient denies any concerns.

## 2018-11-07 NOTE — Progress Notes (Signed)
This encounter was created in error - please disregard.

## 2018-11-11 ENCOUNTER — Other Ambulatory Visit: Payer: Self-pay

## 2018-11-11 ENCOUNTER — Inpatient Hospital Stay: Payer: 59 | Attending: Oncology | Admitting: Oncology

## 2018-11-11 ENCOUNTER — Other Ambulatory Visit: Payer: Self-pay | Admitting: *Deleted

## 2018-11-11 DIAGNOSIS — D0511 Intraductal carcinoma in situ of right breast: Secondary | ICD-10-CM | POA: Diagnosis not present

## 2018-11-11 NOTE — Progress Notes (Signed)
Doximity visit for follow up regarding DCIS.

## 2018-11-11 NOTE — Progress Notes (Signed)
[name]  Telephone:([phone]) [phone] Fax:(336) [phone]  ID: Madison Johnson OB: March 16, 1976  MR#: [id number]  BSW#:[id number]  Patient Care Team: Glendon Axe, MD as PCP - General (Internal Medicine)  I connected with Madison Johnson on 11/11/18 at 10:00 AM EDT by video enabled telemedicine visit and verified that I am speaking with the correct person using two identifiers.   I discussed the limitations, risks, security and privacy concerns of performing an evaluation and management service by telemedicine and the availability of in-person appointments. I also discussed with the patient that there may be a patient responsible charge related to this service. The patient expressed understanding and agreed to proceed.   Other persons participating in the visit and their role in the encounter: Patient, nursing.  Patient's location: Home Provider's location: Clinic  CHIEF COMPLAINT: Multifocal right breast DCIS  INTERVAL HISTORY: Patient agreed to video visit for her routine 1-monthevaluation.  She currently feels well and is asymptomatic.  She is tolerating letrozole without significant side effects.  She was recently started on a progesterone only treatment for pelvic pain which has now resolved.  She has no neurologic complaints. She denies any recent fevers or illnesses.  She denies any chest pain or shortness of breath.  She has no nausea, vomiting, constipation, or diarrhea.  She has a good appetite and denies any weight loss. She denies any pain.  She has no urinary complaints.  Patient offers no specific complaints today.  REVIEW OF SYSTEMS:   Review of Systems  Constitutional: Negative.  Negative for fever, malaise/fatigue and weight loss.  HENT: Negative.  Negative for congestion.   Respiratory: Negative.  Negative for cough and shortness of breath.   Cardiovascular: Negative.  Negative for chest pain and leg swelling.  Gastrointestinal: Negative.  Negative for  abdominal pain, constipation, diarrhea, nausea and vomiting.  Genitourinary: Negative.  Negative for frequency and urgency.  Musculoskeletal: Negative.  Negative for myalgias.  Skin: Negative.  Negative for rash.  Neurological: Negative.  Negative for dizziness, tingling, sensory change, weakness and headaches.  Psychiatric/Behavioral: Negative.  The patient is not nervous/anxious.     As per HPI. Otherwise, a complete review of systems is negative.  PAST MEDICAL HISTORY: Past Medical History:  Diagnosis Date  . Cancer (HCC)    BREAST  . [place] complication [place] [address]  BORDERLINE PER PT-MD TOLD PT TO EXERCISE AND DIET-NO MEDS CURRENTLY  . GERD (gastroesophageal reflux disease)   . Hypothyroidism   . Nonalcoholic steatohepatitis     PAST SURGICAL HISTORY: Past Surgical History:  Procedure Laterality Date  . BILATERAL SALPINGECTOMY Bilateral 09/07/2017   Procedure: BILATERAL SALPINGECTOMY;  Surgeon: Schermerhorn, TGwen Her MD;  Location: [facility];  Service: Gynecology;  Laterality: Bilateral;  . BREAST BIOPSY Right 12/02/2015   axillary nodes removed  . BREAST BIOPSY Left 12/02/2015   neg  . EYE SURGERY Left 2009   GROWTH REMOVED  . MASTECTOMY Right   . MASTECTOMY W/ SENTINEL NODE BIOPSY Right 01/20/2016   Procedure: MASTECTOMY WITH SENTINEL LYMPH NODE BIOPSY;  Surgeon: JLeonie Green MD;  Location: [facility];  Service: General;  Laterality: Right;  . SENTINEL NODE BIOPSY Right 01/20/2016   Procedure: SENTINEL NODE BIOPSY;  Surgeon: JLeonie Green MD;  Location: [facility];  Service: General;  Laterality: Right;  . TUBAL LIGATION    . VAGINAL HYSTERECTOMY N/A 09/07/2017   Procedure: HYSTERECTOMY VAGINAL;  Surgeon: SOuida SillsTGwen Her MD;  Location: [facility];  Service: Gynecology;  Laterality: N/A;    FAMILY HISTORY: Grandfather and maternal aunt with stomach cancer.     ADVANCED DIRECTIVES:    HEALTH MAINTENANCE: Social History   Tobacco  Use  . Smoking status: Never Smoker  . Smokeless tobacco: Never Used  Substance Use Topics  . Alcohol use: Yes    Comment: OCC  . Drug use: No     Colonoscopy:  PAP:  Bone density:  Lipid panel:  Allergies  Allergen Reactions  . Azithromycin Hives    Hives top to bottom,  they burn and turn into little blisters    Current Outpatient Medications  Medication Sig Dispense Refill  . acetaminophen (TYLENOL) 325 MG tablet Take 650 mg by mouth every 6 (six) hours as needed.    . cholecalciferol (VITAMIN D3) 25 MCG (1000 UT) tablet Take 1,000 Units by mouth daily.    Marland Kitchen letrozole (FEMARA) 2.5 MG tablet TAKE 1 TABLET BY MOUTH EVERY DAY 90 tablet 2  . levothyroxine (SYNTHROID, LEVOTHROID) 25 MCG tablet TAKE 1 TAB DAILY ON EMPTY STOMACH WITH GLASS OF WATER AT LEAST 30-60 MIN BEFORE BREAKFAST  1   No current facility-administered medications for this visit.     OBJECTIVE: There were no vitals filed for this visit.   There is no height or weight on file to calculate BMI.    ECOG FS:0 - Asymptomatic  General: Well-developed, well-nourished, no acute distress. HEENT: Normocephalic. Neuro: Alert, answering all questions appropriately. Cranial nerves grossly intact. Skin: No rashes or petechiae noted. Psych: Normal affect.  LAB RESULTS:  Lab Results  Component Value Date   NA 138 09/08/2017   K 3.3 (L) 09/08/2017   CL 107 09/08/2017   CO2 22 09/08/2017   GLUCOSE 102 (H) 09/08/2017   BUN 10 09/08/2017   CREATININE 0.61 09/08/2017   CALCIUM 8.5 (L) 09/08/2017   GFRNONAA >60 09/08/2017   GFRAA >60 09/08/2017    Lab Results  Component Value Date   WBC 10.8 09/08/2017   HGB 13.2 09/08/2017   HCT 38.2 09/08/2017   MCV 84.8 09/08/2017   PLT 284 09/08/2017     STUDIES: No results found.  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  PLAN:    1. Multifocal right breast DCIS: Final pathology did not reveal an invasive component, therefore patient did  not require chemotherapy.  Because she had a total mastectomy, she did not require adjuvant XRT. Tamoxifen was discontinued secondary to side effects.  Continue letrozole for a total of 5 years completing treatment in July 2022.  Her most recent left screening mammogram on January 30, 2018 was reported as BI-RADS 1.  Repeat in July 2020.  Return to clinic in 6 months for routine evaluation.   2. Genetic testing: Patient is BCRA 1 and 2 negative. 3.  Bone health: Bone mineral density from September 09, 2018 reported T score of -0.3 which is unchanged from 2 years prior.  Repeat in February 2022.   4. Pelvic pain: Patient was placed on levonorgestrel by her primary OB/GYN.  Her symptoms have resolved and she plans to discontinue treatment unless her pain recurs.  I provided 15 minutes of face-to-face video visit time during this encounter, and > 50% was spent counseling as documented under my assessment & plan.   Lloyd Huger, MD 11/11/18 6:53 AM

## 2018-11-12 ENCOUNTER — Other Ambulatory Visit: Payer: Self-pay

## 2018-11-12 DIAGNOSIS — D0511 Intraductal carcinoma in situ of right breast: Secondary | ICD-10-CM

## 2019-02-01 ENCOUNTER — Other Ambulatory Visit: Payer: Self-pay | Admitting: Oncology

## 2019-02-03 ENCOUNTER — Ambulatory Visit
Admission: RE | Admit: 2019-02-03 | Discharge: 2019-02-03 | Disposition: A | Discharge status: Home / Self Care | Payer: 59 | Source: Ambulatory Visit | Attending: Oncology | Admitting: Oncology

## 2019-02-03 ENCOUNTER — Other Ambulatory Visit: Payer: Self-pay

## 2019-02-03 DIAGNOSIS — Z1231 Encounter for screening mammogram for malignant neoplasm of breast: Secondary | ICD-10-CM | POA: Insufficient documentation

## 2019-02-03 DIAGNOSIS — D0511 Intraductal carcinoma in situ of right breast: Secondary | ICD-10-CM

## 2019-03-05 DIAGNOSIS — Z6830 Body mass index (BMI) 30.0-30.9, adult: Secondary | ICD-10-CM | POA: Insufficient documentation

## 2019-03-05 DIAGNOSIS — E6609 Other obesity due to excess calories: Secondary | ICD-10-CM | POA: Insufficient documentation

## 2019-03-05 DIAGNOSIS — Z9011 Acquired absence of right breast and nipple: Secondary | ICD-10-CM | POA: Insufficient documentation

## 2019-03-27 ENCOUNTER — Other Ambulatory Visit: Payer: Self-pay

## 2019-03-27 ENCOUNTER — Ambulatory Visit (INDEPENDENT_AMBULATORY_CARE_PROVIDER_SITE_OTHER): Payer: 59 | Admitting: Vascular Surgery

## 2019-03-27 ENCOUNTER — Encounter (INDEPENDENT_AMBULATORY_CARE_PROVIDER_SITE_OTHER): Payer: Self-pay | Admitting: Vascular Surgery

## 2019-03-27 DIAGNOSIS — I872 Venous insufficiency (chronic) (peripheral): Secondary | ICD-10-CM | POA: Diagnosis not present

## 2019-03-27 DIAGNOSIS — I890 Lymphedema, not elsewhere classified: Secondary | ICD-10-CM

## 2019-03-27 DIAGNOSIS — R102 Pelvic and perineal pain: Secondary | ICD-10-CM | POA: Insufficient documentation

## 2019-03-31 DIAGNOSIS — I872 Venous insufficiency (chronic) (peripheral): Secondary | ICD-10-CM | POA: Insufficient documentation

## 2019-03-31 DIAGNOSIS — I890 Lymphedema, not elsewhere classified: Secondary | ICD-10-CM | POA: Insufficient documentation

## 2019-03-31 NOTE — Progress Notes (Addendum)
MRN : [id number]  Madison Johnson is a 43 y.o. (1976/05/26) female who presents with chief complaint of  Chief Complaint  Patient presents with  . New Patient (Initial Visit)  .  History of Present Illness:  Patient is seen for evaluation of left leg pain and left leg swelling. The patient first noticed the swelling remotely. The swelling is associated with pain and discoloration. The pain and swelling worsens with prolonged dependency and improves with elevation. The pain is unrelated to activity.  The patient notes that in the morning the legs are significantly improved but they steadily worsened throughout the course of the day. The patient also notes a steady worsening of the discoloration in the ankle and shin area.   The patient denies claudication symptoms.  The patient denies symptoms consistent with rest pain.  The patient denies and extensive history of DJD and LS spine disease.  The patient has no had any past angiography, interventions or vascular surgery.  Elevation makes the leg symptoms better, dependency makes them much worse. There is no history of ulcerations. The patient denies any recent changes in medications.  The patient has not been wearing graduated compression.  The patient denies a history of DVT or PE. There is no prior history of phlebitis. There is no history of primary lymphedema.  No history of malignancies. No history of trauma or groin or pelvic surgery. There is no history of radiation treatment to the groin or pelvis  The patient denies amaurosis fugax or recent TIA symptoms. There are no recent neurological changes noted. The patient denies recent episodes of angina or shortness of breath  Current Meds  Medication Sig  . cholecalciferol (VITAMIN D3) 25 MCG (1000 UT) tablet Take 1,000 Units by mouth daily.  Marland Kitchen letrozole (FEMARA) 2.5 MG tablet TAKE 1 TABLET BY MOUTH EVERY DAY  . levothyroxine (SYNTHROID, LEVOTHROID) 25 MCG tablet TAKE 1 TAB DAILY ON  EMPTY STOMACH WITH GLASS OF WATER AT LEAST 30-60 MIN BEFORE BREAKFAST    Past Medical History:  Diagnosis Date  . Cancer (HCC)    BREAST  . Diabetes mellitus without complication ([place]) 0[address]   BORDERLINE PER PT-MD TOLD PT TO EXERCISE AND DIET-NO MEDS CURRENTLY  . GERD (gastroesophageal reflux disease)   . Hypothyroidism   . Nonalcoholic steatohepatitis     Past Surgical History:  Procedure Laterality Date  . BILATERAL SALPINGECTOMY Bilateral 09/07/2017   Procedure: BILATERAL SALPINGECTOMY;  Surgeon: Schermerhorn, Gwen Her, MD;  Location: [facility];  Service: Gynecology;  Laterality: Bilateral;  . BREAST BIOPSY Right 12/02/2015   axillary nodes removed  . BREAST BIOPSY Left 12/02/2015   neg  . EYE SURGERY Left 2009   GROWTH REMOVED  . MASTECTOMY Right   . MASTECTOMY W/ SENTINEL NODE BIOPSY Right 01/20/2016   Procedure: MASTECTOMY WITH SENTINEL LYMPH NODE BIOPSY;  Surgeon: Leonie Green, MD;  Location: [facility];  Service: General;  Laterality: Right;  . SENTINEL NODE BIOPSY Right 01/20/2016   Procedure: SENTINEL NODE BIOPSY;  Surgeon: Leonie Green, MD;  Location: [facility];  Service: General;  Laterality: Right;  . TUBAL LIGATION    . VAGINAL HYSTERECTOMY N/A 09/07/2017   Procedure: HYSTERECTOMY VAGINAL;  Surgeon: Schermerhorn, Gwen Her, MD;  Location: [facility];  Service: Gynecology;  Laterality: N/A;    Social History Social History   Tobacco Use  . Smoking status: Never Smoker  . Smokeless tobacco: Never Used  Substance Use Topics  . Alcohol use: Yes  Comment: OCC  . Drug use: No    Family History Family History  Problem Relation Age of Onset  . Breast cancer Neg Hx   No family history of bleeding/clotting disorders, porphyria or autoimmune disease   Allergies  Allergen Reactions  . Azithromycin Hives    Hives top to bottom,  they burn and turn into little blisters     REVIEW OF SYSTEMS (Negative unless checked)  Constitutional: [] Weight loss   [] Fever  [] Chills Cardiac: [] Chest pain   [] Chest pressure   [] Palpitations   [] Shortness of breath when laying flat   [] Shortness of breath with exertion. Vascular:  [] Pain in legs with walking   [x] Pain in legs at rest  [] History of DVT   [] Phlebitis   [x] Swelling in legs   [] Varicose veins   [] Non-healing ulcers Pulmonary:   [] Uses home oxygen   [] Productive cough   [] Hemoptysis   [] Wheeze  [] COPD   [] Asthma Neurologic:  [] Dizziness   [] Seizures   [] History of stroke   [] History of TIA  [] Aphasia   [] Vissual changes   [] Weakness or numbness in arm   [] Weakness or numbness in leg Musculoskeletal:   [] Joint swelling   [] Joint pain   [] Low back pain Hematologic:  [] Easy bruising  [] Easy bleeding   [] Hypercoagulable state   [] Anemic Gastrointestinal:  [] Diarrhea   [] Vomiting  [] Gastroesophageal reflux/heartburn   [] Difficulty swallowing. Genitourinary:  [] Chronic kidney disease   [] Difficult urination  [] Frequent urination   [] Blood in urine Skin:  [] Rashes   [] Ulcers  Psychological:  [] History of anxiety   []  History of major depression.  Physical Examination  Vitals:   03/27/19 1416  BP: (!) 142/97  Pulse: 79  Resp: 12  Weight: 159 lb (72.1 kg)  Height: 5' (1.524 m)   Body mass index is 31.05 kg/m. Gen: WD/WN, NAD Head: [place]/AT, No temporalis wasting.  Ear/Nose/Throat: Hearing grossly intact, nares w/o erythema or drainage, poor dentition Eyes: PER, EOMI, sclera nonicteric.  Neck: Supple, no masses.  No bruit or JVD.  Pulmonary:  Good air movement, clear to auscultation bilaterally, no use of accessory muscles.  Cardiac: RRR, normal S1, S2, no Murmurs. Vascular: scattered varicosities present bilaterally.  Mild to moderate venous stasis changes to the legs bilaterally. 3-4+ soft pitting edema left leg Vessel Right Left  Radial Palpable Palpable  PT Palpable Palpable  DP Palpable Palpable  Gastrointestinal: soft, non-distended. No guarding/no peritoneal signs.  Musculoskeletal: M/S  5/5 throughout.  No deformity or atrophy.  Neurologic: CN 2-12 intact. Pain and light touch intact in extremities.  Symmetrical.  Speech is fluent. Motor exam as listed above. Psychiatric: Judgment intact, Mood & affect appropriate for pt's clinical situation. Dermatologic: No rashes or ulcers noted.  No changes consistent with cellulitis. Lymph : No Cervical lymphadenopathy, no lichenification or skin changes of chronic lymphedema.  CBC Lab Results  Component Value Date   WBC 10.8 09/08/2017   HGB 13.2 09/08/2017   HCT 38.2 09/08/2017   MCV 84.8 09/08/2017   PLT 284 09/08/2017    BMET    Component Value Date/Time   NA 138 09/08/2017 0710   K 3.3 (L) 09/08/2017 0710   CL 107 09/08/2017 0710   CO2 22 09/08/2017 0710   GLUCOSE 102 (H) 09/08/2017 0710   BUN 10 09/08/2017 0710   CREATININE 0.61 09/08/2017 0710   CALCIUM 8.5 (L) 09/08/2017 0710   GFRNONAA >60 09/08/2017 0710   GFRAA >60 09/08/2017 0710   CrCl cannot be calculated (Patient's most  recent lab result is older than the maximum 21 days allowed.).  COAG No results found for: INR, PROTIME  Radiology No results found.   Assessment/Plan 1. Lymphedema I have had a long discussion with the patient regarding swelling and why it  causes symptoms.  Patient will begin wearing graduated compression stockings class 1 (20-30 mmHg) on a daily basis a prescription was given. The patient will  beginning wearing the stockings first thing in the morning and removing them in the evening. The patient is instructed specifically not to sleep in the stockings.   In addition, behavioral modification will be initiated.  This will include frequent elevation, use of over the counter pain medications and exercise such as walking.  I have reviewed systemic causes for chronic edema such as liver, kidney and cardiac etiologies.  The patient denies problems with these organ systems.    Consideration for a lymph pump will also be made based upon  the effectiveness of conservative therapy.  This would help to improve the edema control and prevent sequela such as ulcers and infections   Patient should undergo duplex ultrasound of the venous system to ensure that DVT or reflux is not present.  The patient will follow-up with me after the ultrasound.    A total of 60 minutes was spent with this patient and greater than 50% was spent in counseling and coordination of care with the patient.  Discussion included the treatment options for vascular disease including indications for surgery and intervention.  Also discussed is the appropriate timing of treatment.  In addition medical therapy was discussed.  2. Chronic venous insufficiency No surgery or intervention at this point in time.    I have had a long discussion with the patient regarding venous insufficiency and why it  causes symptoms. I have discussed with the patient the chronic skin changes that accompany venous insufficiency and the long term sequela such as infection and ulceration.  Patient will begin wearing graduated compression stockings class 1 (20-30 mmHg) or compression wraps on a daily basis a prescription was given. The patient will put the stockings on first thing in the morning and removing them in the evening. The patient is instructed specifically not to sleep in the stockings.    In addition, behavioral modification including several periods of elevation of the lower extremities during the day will be continued. I have demonstrated that proper elevation is a position with the ankles at heart level.  The patient is instructed to begin routine exercise, especially walking on a daily basis  Patient should undergo duplex ultrasound of the venous system to ensure that DVT or reflux is not present.  Following the review of the ultrasound the patient will follow up in 2-3 months to reassess the degree of swelling and the control that graduated compression stockings or  compression wraps  is offering.   The patient can be assessed for a Lymph Pump at that time    Hortencia Pilar, MD  03/31/2019 11:04 AM

## 2019-04-01 ENCOUNTER — Other Ambulatory Visit: Payer: Self-pay

## 2019-04-01 ENCOUNTER — Ambulatory Visit (INDEPENDENT_AMBULATORY_CARE_PROVIDER_SITE_OTHER): Payer: 59 | Admitting: Nurse Practitioner

## 2019-04-01 ENCOUNTER — Ambulatory Visit (INDEPENDENT_AMBULATORY_CARE_PROVIDER_SITE_OTHER): Payer: 59

## 2019-04-01 ENCOUNTER — Encounter (INDEPENDENT_AMBULATORY_CARE_PROVIDER_SITE_OTHER): Payer: Self-pay | Admitting: Nurse Practitioner

## 2019-04-01 VITALS — BP 125/85 | HR 82 | Resp 16 | Ht 60.0 in | Wt 158.0 lb

## 2019-04-01 DIAGNOSIS — I872 Venous insufficiency (chronic) (peripheral): Secondary | ICD-10-CM

## 2019-04-01 DIAGNOSIS — I890 Lymphedema, not elsewhere classified: Secondary | ICD-10-CM

## 2019-04-01 NOTE — Progress Notes (Signed)
SUBJECTIVE:  Patient ID: Madison Johnson, female    DOB: 05-06-1976, 43 y.o.   MRN: [id number] Chief Complaint  Patient presents with  . Follow-up    ultrasound    HPI  Madison Johnson is a 43 y.o. female that presents today with complaints of swelling to her left lower extremity.  The patient has never had a DVT to her knowledge.  The patient has also had a past history of breast cancer.  She states that the swelling tends to be worse during the evening however it does not normally completely return to normal in the morning.  She states that sometimes it takes 2 to 3 days for to return to normal and then she may have several days where there is no swelling.  The patient denies any use of medical grade 1 compression socks in the past.  She denies using elevation.  She did not notice a pattern of when the swelling began such as with salty foods or dehydration.  She denies any wounds or wounds or ulcerations.  She denies any fever, chills, nausea, vomiting or diarrhea.  Today noninvasive study showed no evidence of DVT or superficial venous thrombosis within the left lower extremity.  There is reflux within the common femoral vein and saphenofemoral junction.  Past Medical History:  Diagnosis Date  . Cancer (HCC)    BREAST  . Diabetes mellitus without complication ([place]) 9381   BORDERLINE PER PT-MD TOLD PT TO EXERCISE AND DIET-NO MEDS CURRENTLY  . GERD (gastroesophageal reflux disease)   . Hypothyroidism   . Nonalcoholic steatohepatitis     Past Surgical History:  Procedure Laterality Date  . BILATERAL SALPINGECTOMY Bilateral 09/07/2017   Procedure: BILATERAL SALPINGECTOMY;  Surgeon: Schermerhorn, Gwen Her, MD;  Location: [facility];  Service: Gynecology;  Laterality: Bilateral;  . BREAST BIOPSY Right 12/02/2015   axillary nodes removed  . BREAST BIOPSY Left 12/02/2015   neg  . EYE SURGERY Left 2009   GROWTH REMOVED  . MASTECTOMY Right   . MASTECTOMY W/ SENTINEL NODE BIOPSY Right  01/20/2016   Procedure: MASTECTOMY WITH SENTINEL LYMPH NODE BIOPSY;  Surgeon: Leonie Green, MD;  Location: [facility];  Service: General;  Laterality: Right;  . SENTINEL NODE BIOPSY Right 01/20/2016   Procedure: SENTINEL NODE BIOPSY;  Surgeon: Leonie Green, MD;  Location: [facility];  Service: General;  Laterality: Right;  . TUBAL LIGATION    . VAGINAL HYSTERECTOMY N/A 09/07/2017   Procedure: HYSTERECTOMY VAGINAL;  Surgeon: Schermerhorn, Gwen Her, MD;  Location: [facility];  Service: Gynecology;  Laterality: N/A;    Social History   Socioeconomic History  . Marital status: Married    Spouse name: Not on file  . Number of children: Not on file  . Years of education: Not on file  . Highest education level: Not on file  Occupational History  . Not on file  Social Needs  . Financial resource strain: Not on file  . Food insecurity    Worry: Not on file    Inability: Not on file  . Transportation needs    Medical: Not on file    Non-medical: Not on file  Tobacco Use  . Smoking status: Never Smoker  . Smokeless tobacco: Never Used  Substance and Sexual Activity  . Alcohol use: Yes    Comment: OCC  . Drug use: No  . Sexual activity: Yes  Lifestyle  . Physical activity    Days per week: Not on file  Minutes per session: Not on file  . Stress: Not on file  Relationships  . Social Herbalist on phone: Not on file    Gets together: Not on file    Attends religious service: Not on file    Active member of club or organization: Not on file    Attends meetings of clubs or organizations: Not on file    Relationship status: Not on file  . Intimate partner violence    Fear of current or ex partner: Not on file    Emotionally abused: Not on file    Physically abused: Not on file    Forced sexual activity: Not on file  Other Topics Concern  . Not on file  Social History Narrative  . Not on file    Family History  Problem Relation Age of Onset  . Breast cancer  Neg Hx     Allergies  Allergen Reactions  . Azithromycin Hives    Hives top to bottom,  they burn and turn into little blisters     Review of Systems   Review of Systems: Negative Unless Checked Constitutional: [] Weight loss  [] Fever  [] Chills Cardiac: [] Chest pain   []  Atrial Fibrillation  [] Palpitations   [] Shortness of breath when laying flat   [] Shortness of breath with exertion. [] Shortness of breath at rest Vascular:  [] Pain in legs with walking   [] Pain in legs with standing [] Pain in legs when laying flat   [] Claudication    [] Pain in feet when laying flat    [] History of DVT   [] Phlebitis   [x] Swelling in legs   [x] Varicose veins   [] Non-healing ulcers Pulmonary:   [] Uses home oxygen   [] Productive cough   [] Hemoptysis   [] Wheeze  [] COPD   [] Asthma Neurologic:  [] Dizziness   [] Seizures  [] Blackouts [] History of stroke   [] History of TIA  [] Aphasia   [] Temporary Blindness   [] Weakness or numbness in arm   [] Weakness or numbness in leg Musculoskeletal:   [] Joint swelling   [] Joint pain   [] Low back pain  []  History of Knee Replacement [] Arthritis [] back Surgeries  []  Spinal Stenosis    Hematologic:  [] Easy bruising  [] Easy bleeding   [x] Hypercoagulable state   [] Anemic Gastrointestinal:  [] Diarrhea   [] Vomiting  [x] Gastroesophageal reflux/heartburn   [] Difficulty swallowing. [] Abdominal pain Genitourinary:  [] Chronic kidney disease   [] Difficult urination  [] Anuric   [] Blood in urine [] Frequent urination  [] Burning with urination   [] Hematuria Skin:  [] Rashes   [] Ulcers [] Wounds Psychological:  [] History of anxiety   []  History of major depression  []  Memory Difficulties      OBJECTIVE:   Physical Exam  BP 125/85 (BP Location: Left Arm)   Pulse 82   Resp 16   Ht 5' (1.524 m)   Wt 158 lb (71.7 kg)   BMI 30.86 kg/m   Gen: WD/WN, NAD Head: [place]/AT, No temporalis wasting.  Ear/Nose/Throat: Hearing grossly intact, nares w/o erythema or drainage Eyes: PER, EOMI, sclera  nonicteric.  Neck: Supple, no masses.  No JVD.  Pulmonary:  Good air movement, no use of accessory muscles.  Cardiac: RRR Vascular: 2+ edema bilaterally Vessel Right Left  Radial Palpable Palpable  Dorsalis Pedis Palpable Palpable  Posterior Tibial Palpable Palpable   Gastrointestinal: soft, non-distended. No guarding/no peritoneal signs.  Musculoskeletal: M/S 5/5 throughout.  No deformity or atrophy.  Neurologic: Pain and light touch intact in extremities.  Symmetrical.  Speech is fluent. Motor exam as  listed above. Psychiatric: Judgment intact, Mood & affect appropriate for pt's clinical situation. Dermatologic: No Venous rashes. No Ulcers Noted.  No changes consistent with cellulitis. Lymph : No Cervical lymphadenopathy, no lichenification or skin changes of chronic lymphedema.       ASSESSMENT AND PLAN:  1. Lymphedema The patient will continue to follow the conservative therapy methods outlined below.  We will have the patient return in approximately 3 months to evaluate her status.  We will also discussed the possibility of adding a lymph pump to her therapy to see if it will possibly provide a positive factor in her care.  2. Chronic venous insufficiency  Recommend:  The patient has large symptomatic varicose veins that are painful and associated with swelling.  I have had a long discussion with the patient regarding  varicose veins and why they cause symptoms.  Patient will begin wearing graduated compression stockings class 1 on a daily basis, beginning first thing in the morning and removing them in the evening. The patient is instructed specifically not to sleep in the stockings.    The patient  will also begin using over-the-counter analgesics such as Motrin 600 mg po TID to help control the symptoms.    In addition, behavioral modification including elevation during the day will be initiated.    Pending the results of these changes the  patient will be reevaluated in  three months.     Current Outpatient Medications on File Prior to Visit  Medication Sig Dispense Refill  . cholecalciferol (VITAMIN D3) 25 MCG (1000 UT) tablet Take 1,000 Units by mouth daily.    Marland Kitchen letrozole (FEMARA) 2.5 MG tablet TAKE 1 TABLET BY MOUTH EVERY DAY 90 tablet 2  . levothyroxine (SYNTHROID, LEVOTHROID) 25 MCG tablet TAKE 1 TAB DAILY ON EMPTY STOMACH WITH GLASS OF WATER AT LEAST 30-60 MIN BEFORE BREAKFAST  1  . acetaminophen (TYLENOL) 325 MG tablet Take 650 mg by mouth every 6 (six) hours as needed.     No current facility-administered medications on file prior to visit.     There are no Patient Instructions on file for this visit. No follow-ups on file.   Kris Hartmann, NP  This note was completed with Sales executive.  Any errors are purely unintentional.

## 2019-04-11 ENCOUNTER — Other Ambulatory Visit: Payer: Self-pay | Admitting: Surgery

## 2019-04-11 DIAGNOSIS — Z1231 Encounter for screening mammogram for malignant neoplasm of breast: Secondary | ICD-10-CM

## 2019-05-10 NOTE — Progress Notes (Signed)
[facility]  [phone] [phone]  ID: [name] OB: [birth year]  MR#: [id number]  QHU#:[id number]  Patient Care Team: Dion Body, MD as PCP - General (Family Medicine)   CHIEF COMPLAINT: Multifocal right breast DCIS  INTERVAL HISTORY: Patient returns to clinic today for routine 36-monthevaluation.  She continues to feel well and remains asymptomatic.  She is tolerating letrozole without significant side effects.  She has no neurologic complaints. She denies any recent fevers or illnesses.  She denies any chest pain, shortness of breath, cough, or hemoptysis.  She has no nausea, vomiting, constipation, or diarrhea.  She has a good appetite and denies any weight loss.  She denies any further pelvic pain.  She has no urinary complaints.  Patient feels at her baseline and offers no specific complaints today.  REVIEW OF SYSTEMS:   Review of Systems  Constitutional: Negative.  Negative for fever, malaise/fatigue and weight loss.  HENT: Negative.  Negative for congestion.   Respiratory: Negative.  Negative for cough and shortness of breath.   Cardiovascular: Negative.  Negative for chest pain and leg swelling.  Gastrointestinal: Negative.  Negative for abdominal pain, constipation, diarrhea, nausea and vomiting.  Genitourinary: Negative.  Negative for frequency and urgency.  Musculoskeletal: Negative.  Negative for myalgias.  Skin: Negative.  Negative for rash.  Neurological: Negative.  Negative for dizziness, tingling, sensory change, weakness and headaches.  Psychiatric/Behavioral: Negative.  The patient is not nervous/anxious.     As per HPI. Otherwise, a complete review of systems is negative.  PAST MEDICAL HISTORY: Past Medical History:  Diagnosis Date  . Cancer (HCC)    BREAST  . Diabetes mellitus without complication ([place] [address]  BORDERLINE PER PT-MD TOLD PT TO EXERCISE AND DIET-NO MEDS CURRENTLY  . GERD (gastroesophageal  reflux disease)   . Hypothyroidism   . Nonalcoholic steatohepatitis     PAST SURGICAL HISTORY: Past Surgical History:  Procedure Laterality Date  . BILATERAL SALPINGECTOMY Bilateral 09/07/2017   Procedure: BILATERAL SALPINGECTOMY;  Surgeon: Schermerhorn, TGwen Her MD;  Location: [facility];  Service: Gynecology;  Laterality: Bilateral;  . BREAST BIOPSY Right 12/02/2015   axillary nodes removed  . BREAST BIOPSY Left 12/02/2015   neg  . EYE SURGERY Left 2009   GROWTH REMOVED  . MASTECTOMY Right   . MASTECTOMY W/ SENTINEL NODE BIOPSY Right 01/20/2016   Procedure: MASTECTOMY WITH SENTINEL LYMPH NODE BIOPSY;  Surgeon: JLeonie Green MD;  Location: [facility];  Service: General;  Laterality: Right;  . SENTINEL NODE BIOPSY Right 01/20/2016   Procedure: SENTINEL NODE BIOPSY;  Surgeon: JLeonie Green MD;  Location: [facility];  Service: General;  Laterality: Right;  . TUBAL LIGATION    . VAGINAL HYSTERECTOMY N/A 09/07/2017   Procedure: HYSTERECTOMY VAGINAL;  Surgeon: Schermerhorn, TGwen Her MD;  Location: [facility];  Service: Gynecology;  Laterality: N/A;    FAMILY HISTORY: Grandfather and maternal aunt with stomach cancer.     ADVANCED DIRECTIVES:    HEALTH MAINTENANCE: Social History   Tobacco Use  . Smoking status: Never Smoker  . Smokeless tobacco: Never Used  Substance Use Topics  . Alcohol use: Yes    Comment: OCC  . Drug use: No     Colonoscopy:  PAP:  Bone density:  Lipid panel:  Allergies  Allergen Reactions  . Azithromycin Hives    Hives top to bottom,  they burn and turn into little blisters    Current Outpatient Medications  Medication Sig Dispense  Refill  . acetaminophen (TYLENOL) 325 MG tablet Take 650 mg by mouth every 6 (six) hours as needed.    . cholecalciferol (VITAMIN D3) 25 MCG (1000 UT) tablet Take 1,000 Units by mouth daily.    Marland Kitchen letrozole (FEMARA) 2.5 MG tablet TAKE 1 TABLET BY MOUTH EVERY DAY 90 tablet 2  . levothyroxine (SYNTHROID,  LEVOTHROID) 25 MCG tablet TAKE 1 TAB DAILY ON EMPTY STOMACH WITH GLASS OF WATER AT LEAST 30-60 MIN BEFORE BREAKFAST  1   No current facility-administered medications for this visit.     OBJECTIVE: Vitals:   05/15/19 1420  BP: 122/73  Pulse: 85  Temp: 98.3 F (36.8 C)     Body mass index is 31.62 kg/m.    ECOG FS:0 - Asymptomatic  General: Well-developed, well-nourished, no acute distress. Eyes: Pink conjunctiva, anicteric sclera. HEENT: Normocephalic, moist mucous membranes. Breast: Patient declined breast exam today. Lungs: Clear to auscultation bilaterally. Heart: Regular rate and rhythm. No rubs, murmurs, or gallops. Abdomen: Soft, nontender, nondistended. No organomegaly noted, normoactive bowel sounds. Musculoskeletal: No edema, cyanosis, or clubbing. Neuro: Alert, answering all questions appropriately. Cranial nerves grossly intact. Skin: No rashes or petechiae noted. Psych: Normal affect.  LAB RESULTS:  Lab Results  Component Value Date   NA 138 09/08/2017   K 3.3 (L) 09/08/2017   CL 107 09/08/2017   CO2 22 09/08/2017   GLUCOSE 102 (H) 09/08/2017   BUN 10 09/08/2017   CREATININE 0.61 09/08/2017   CALCIUM 8.5 (L) 09/08/2017   GFRNONAA >60 09/08/2017   GFRAA >60 09/08/2017    Lab Results  Component Value Date   WBC 10.8 09/08/2017   HGB 13.2 09/08/2017   HCT 38.2 09/08/2017   MCV 84.8 09/08/2017   PLT 284 09/08/2017     STUDIES: No results found.  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  PLAN:    1. Multifocal right breast DCIS: Final pathology did not reveal an invasive component, therefore patient did not require chemotherapy.  Because she had a total mastectomy, she did not require adjuvant XRT. Tamoxifen was discontinued secondary to side effects.  Continue letrozole for a total of 5 years completing treatment in July 2022.  Patient's most recent left screening mammogram on February 03, 2019 was reported as BI-RADS 1, repeat in  July 2021.  Return to clinic in 6 months for video assisted telemedicine visit.   2. Genetic testing: Patient is BCRA 1 and 2 negative. 3.  Bone health: Bone mineral density from September 09, 2018 reported T score of -0.3 which is unchanged from 2 years prior.  Repeat in February 2022.   4. Pelvic pain: Resolved.  I spent a total of 20 minutes face-to-face with the patient of which greater than 50% of the visit was spent in counseling and coordination of care as detailed above.   Lloyd Huger, MD 05/15/19 3:59 PM

## 2019-05-14 ENCOUNTER — Other Ambulatory Visit: Payer: Self-pay

## 2019-05-14 NOTE — Progress Notes (Signed)
Patient pre screened for office appointment, no questions or concerns today. 

## 2019-05-15 ENCOUNTER — Other Ambulatory Visit: Payer: Self-pay

## 2019-05-15 ENCOUNTER — Inpatient Hospital Stay: Payer: 59 | Attending: Oncology | Admitting: Oncology

## 2019-05-15 VITALS — BP 122/73 | HR 85 | Temp 98.3°F | Wt 161.9 lb

## 2019-05-15 DIAGNOSIS — Z881 Allergy status to other antibiotic agents status: Secondary | ICD-10-CM | POA: Diagnosis not present

## 2019-05-15 DIAGNOSIS — Z79811 Long term (current) use of aromatase inhibitors: Secondary | ICD-10-CM | POA: Insufficient documentation

## 2019-05-15 DIAGNOSIS — D0511 Intraductal carcinoma in situ of right breast: Secondary | ICD-10-CM | POA: Diagnosis present

## 2019-05-15 DIAGNOSIS — Z9079 Acquired absence of other genital organ(s): Secondary | ICD-10-CM | POA: Insufficient documentation

## 2019-05-15 DIAGNOSIS — Z6831 Body mass index (BMI) 31.0-31.9, adult: Secondary | ICD-10-CM | POA: Insufficient documentation

## 2019-05-15 DIAGNOSIS — Z9010 Acquired absence of unspecified breast and nipple: Secondary | ICD-10-CM | POA: Insufficient documentation

## 2019-05-15 DIAGNOSIS — Z800 Family history of malignant neoplasm of digestive organs: Secondary | ICD-10-CM | POA: Insufficient documentation

## 2019-05-15 DIAGNOSIS — Z79899 Other long term (current) drug therapy: Secondary | ICD-10-CM | POA: Diagnosis not present

## 2019-07-02 ENCOUNTER — Ambulatory Visit (INDEPENDENT_AMBULATORY_CARE_PROVIDER_SITE_OTHER): Payer: 59 | Admitting: Nurse Practitioner

## 2019-08-06 ENCOUNTER — Ambulatory Visit (INDEPENDENT_AMBULATORY_CARE_PROVIDER_SITE_OTHER): Payer: 59 | Admitting: Nurse Practitioner

## 2019-08-10 IMAGING — MG MM DIGITAL DIAGNOSTIC UNILAT*L* W/ TOMO W/ CAD
6 of 9 series · 6 of 21 positions shown · non-contrast
Comparison: Previous exam(s).

CLINICAL DATA: Personal history of right breast cancer status post
mastectomy. Palpable lump left breast.

EXAM:
2D DIGITAL DIAGNOSTIC LEFT MAMMOGRAM WITH CAD AND ADJUNCT TOMO
ULTRASOUND LEFT BREAST

[L CC synth-2D (1 of 2)]
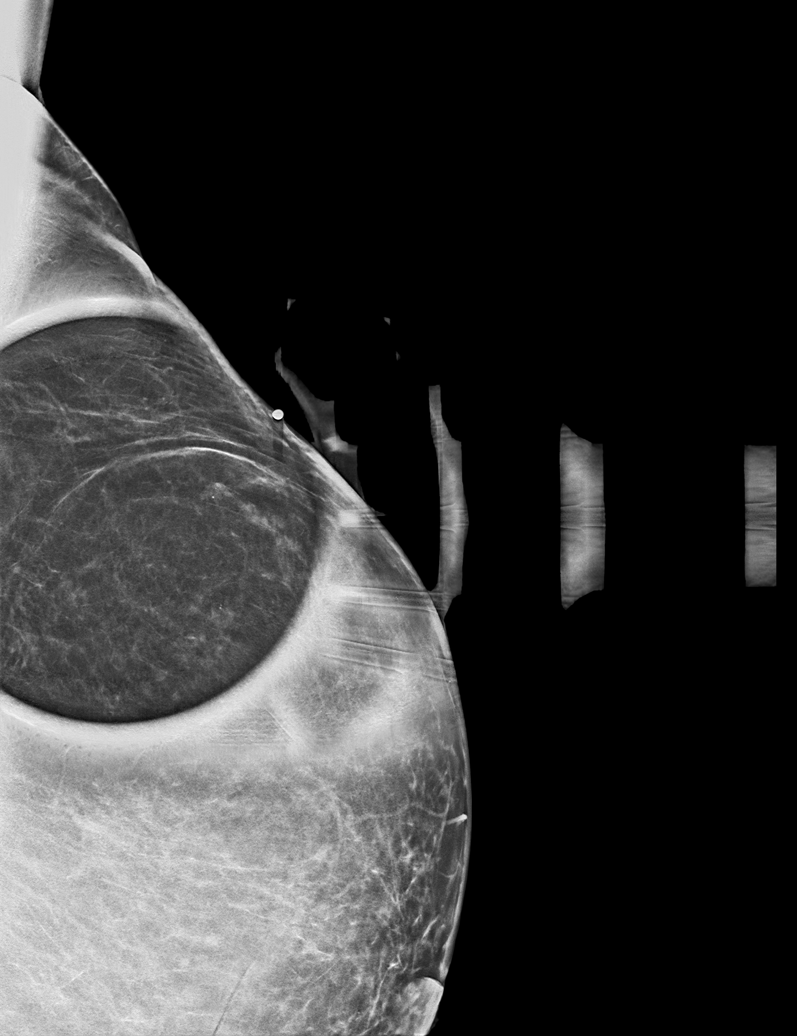

[L MLO]
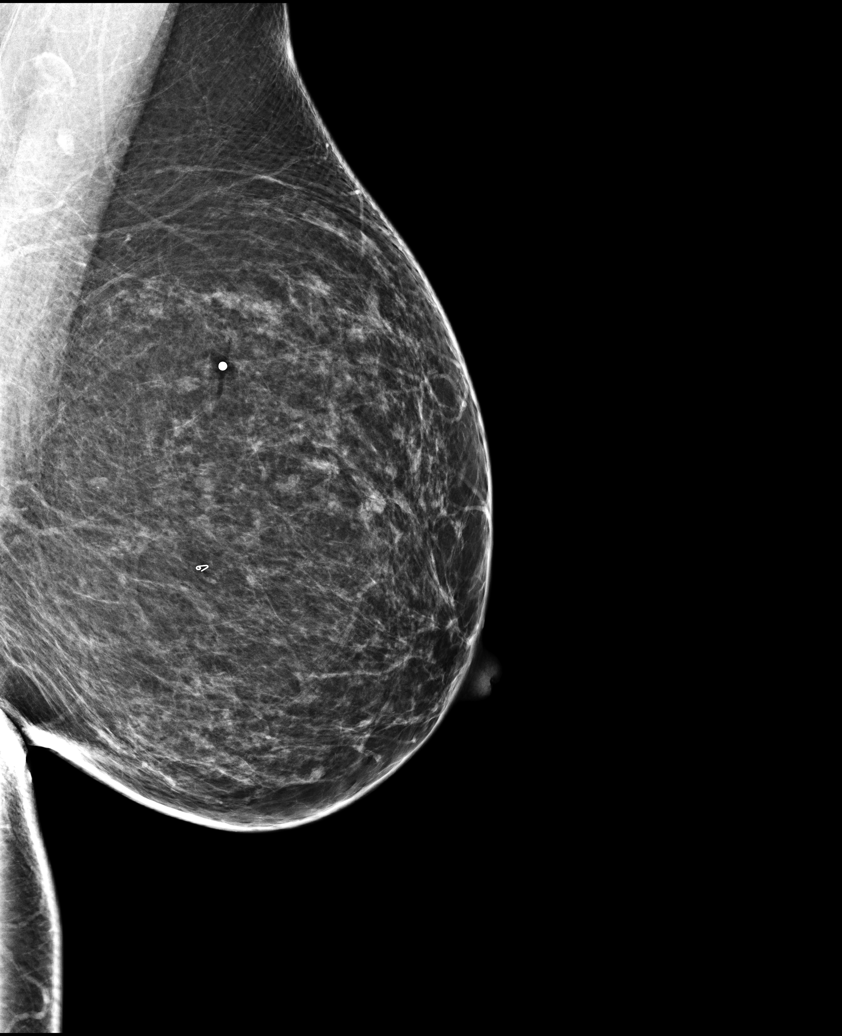

[L CC (1 of 2)]
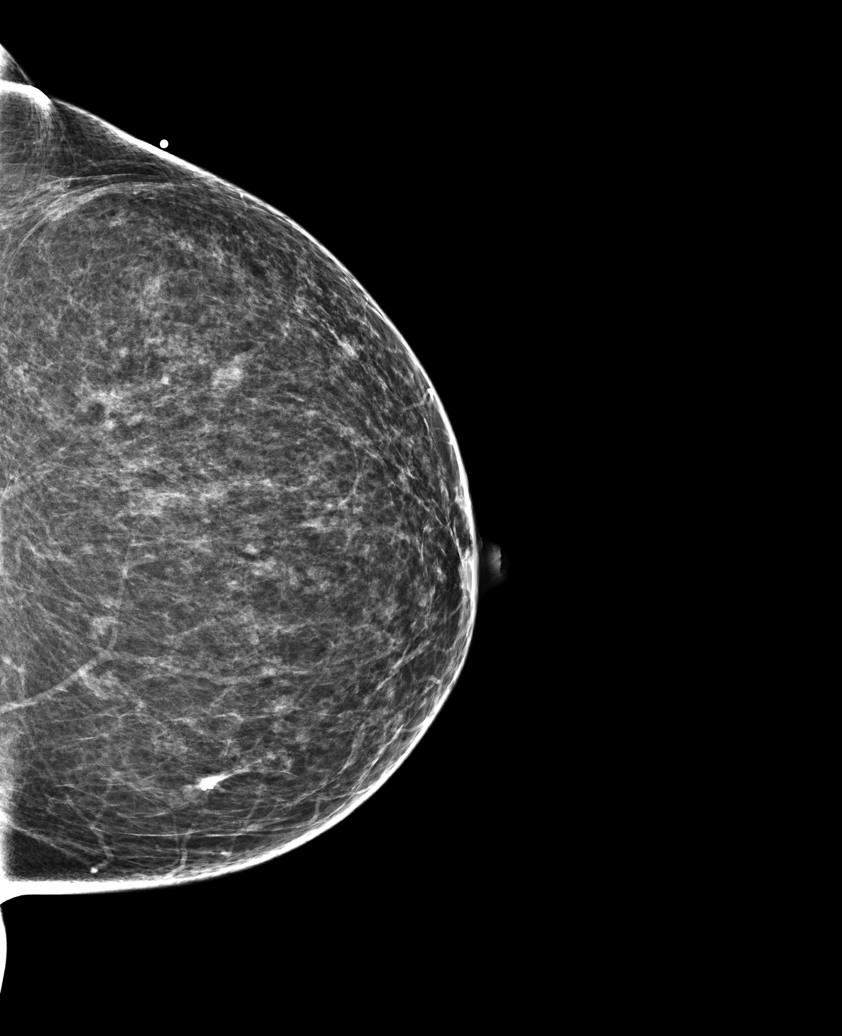

[L CC synth-2D (2 of 2)]
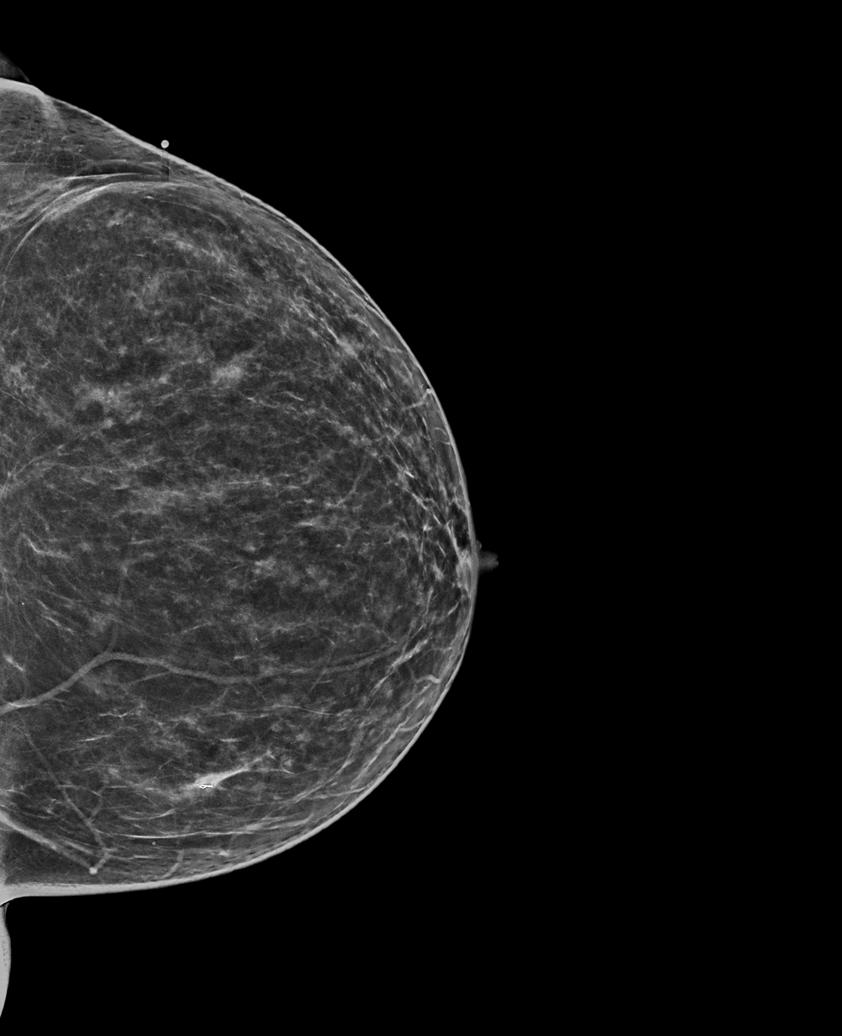

[L MLO synth-2D]
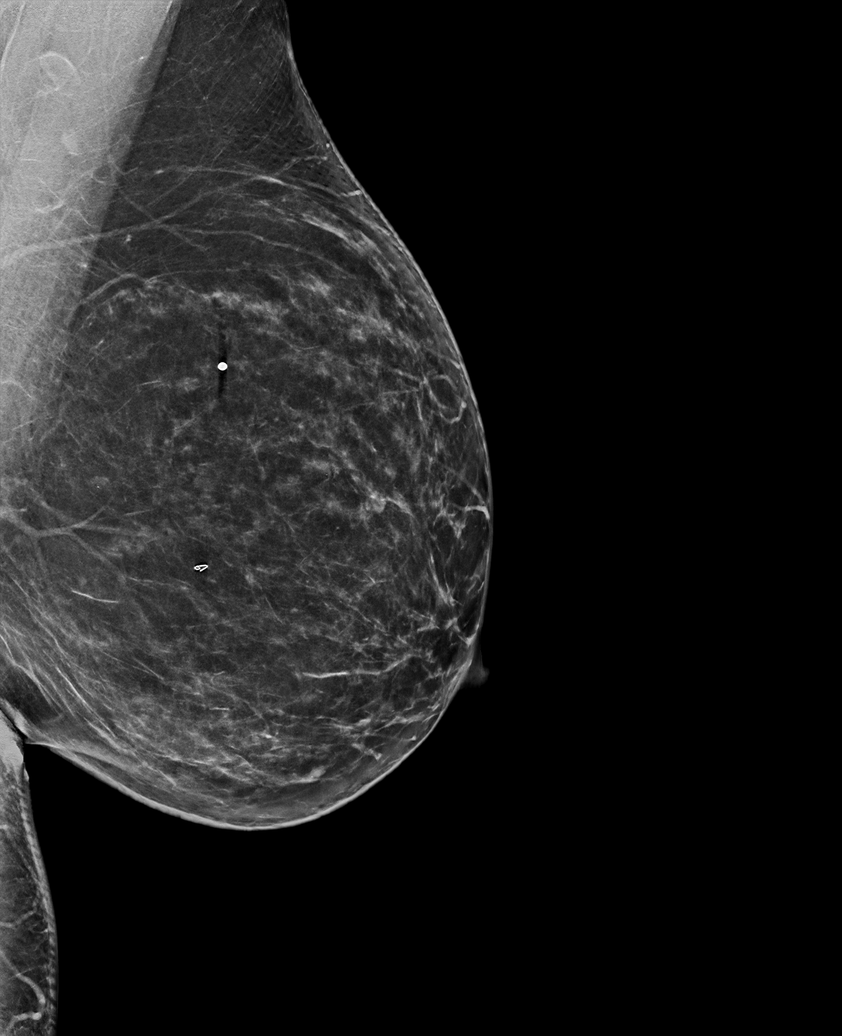

[L CC (2 of 2)]
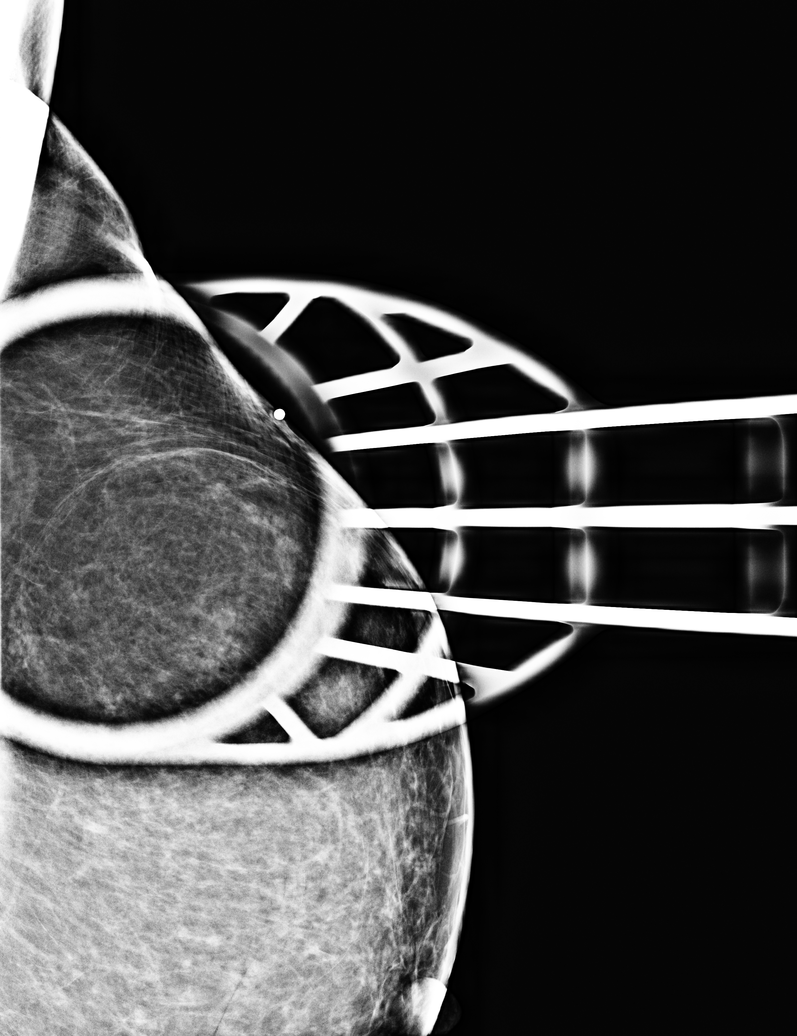

[6 of 21 positions shown; findings below may reference images not displayed]

ACR Breast Density Category b: There are scattered areas of
fibroglandular density.
FINDINGS: CC and MLO views of the left breast, spot tangential view of left
breast are submitted. No suspicious abnormalities identified within
the left breast.

Mammographic images were processed with CAD.

Targeted ultrasound is performed, showing no focal abnormal discrete
cystic or solid lesion at the palpable area left breast 1-2 o'clock
positions.
IMPRESSION: Benign findings.

RECOMMENDATION:
Routine screening mammogram in 1 year.

I have discussed the findings and recommendations with the patient.
Results were also provided in writing at the conclusion of the
visit. If applicable, a reminder letter will be sent to the patient
regarding the next appointment.

BI-RADS CATEGORY  2: Benign.

## 2019-08-27 ENCOUNTER — Ambulatory Visit (INDEPENDENT_AMBULATORY_CARE_PROVIDER_SITE_OTHER): Payer: 59 | Admitting: Nurse Practitioner

## 2019-08-27 ENCOUNTER — Other Ambulatory Visit: Payer: Self-pay

## 2019-08-27 ENCOUNTER — Encounter (INDEPENDENT_AMBULATORY_CARE_PROVIDER_SITE_OTHER): Payer: Self-pay | Admitting: Nurse Practitioner

## 2019-08-27 VITALS — BP 126/85 | HR 91 | Resp 16 | Wt 164.0 lb

## 2019-08-27 DIAGNOSIS — I890 Lymphedema, not elsewhere classified: Secondary | ICD-10-CM | POA: Diagnosis not present

## 2019-08-27 DIAGNOSIS — I872 Venous insufficiency (chronic) (peripheral): Secondary | ICD-10-CM | POA: Diagnosis not present

## 2019-09-01 ENCOUNTER — Encounter (INDEPENDENT_AMBULATORY_CARE_PROVIDER_SITE_OTHER): Payer: Self-pay | Admitting: Nurse Practitioner

## 2019-09-01 NOTE — Progress Notes (Signed)
SUBJECTIVE:  Patient ID: Madison Johnson, female    DOB: March 28, 1976, 44 y.o.   MRN: [id number] Chief Complaint  Patient presents with  . Follow-up    7monthfollow up    HPI  Madison ADKISONis a 44y.o. female that presents today for follow-up after noninvasive studies 3 months ago due to swelling of her left lower extremity.  The patient does have a noted past history of breast cancer.  Since that time the patient has been engaging in medical grade 1 conservative therapy including wearing compression socks, elevation in addition to cutting salty foods and exercise.  The patient does note that the swelling has improved slightly however despite adherence to conservative therapy she still continues to have swelling.  Previous noninvasive study showed reflux in the common femoral vein and saphenofemoral junction.  Past Medical History:  Diagnosis Date  . Cancer (HCC)    BREAST  . [place] [address]  BORDERLINE PER PT-MD TOLD PT TO EXERCISE AND DIET-NO MEDS CURRENTLY  . GERD (gastroesophageal reflux disease)   . Hypothyroidism   . Nonalcoholic steatohepatitis     Past Surgical History:  Procedure Laterality Date  . BILATERAL SALPINGECTOMY Bilateral 09/07/2017   Procedure: BILATERAL SALPINGECTOMY;  Surgeon: Schermerhorn, TGwen Her MD;  Location: [facility];  Service: Gynecology;  Laterality: Bilateral;  . BREAST BIOPSY Right 12/02/2015   axillary nodes removed  . BREAST BIOPSY Left 12/02/2015   neg  . EYE SURGERY Left 2009   GROWTH REMOVED  . MASTECTOMY Right   . MASTECTOMY W/ SENTINEL NODE BIOPSY Right 01/20/2016   Procedure: MASTECTOMY WITH SENTINEL LYMPH NODE BIOPSY;  Surgeon: JLeonie Green MD;  Location: [facility];  Service: General;  Laterality: Right;  . SENTINEL NODE BIOPSY Right 01/20/2016   Procedure: SENTINEL NODE BIOPSY;  Surgeon: JLeonie Green MD;  Location: [facility];  Service: General;  Laterality: Right;  . TUBAL LIGATION    .  VAGINAL HYSTERECTOMY N/A 09/07/2017   Procedure: HYSTERECTOMY VAGINAL;  Surgeon: Schermerhorn, TGwen Her MD;  Location: [facility];  Service: Gynecology;  Laterality: N/A;    Social History   Socioeconomic History  . Marital status: Married    Spouse name: Not on file  . Number of children: Not on file  . Years of education: Not on file  . Highest education level: Not on file  Occupational History  . Not on file  Tobacco Use  . Smoking status: Never Smoker  . Smokeless tobacco: Never Used  Substance and Sexual Activity  . Alcohol use: Yes    Comment: OCC  . Drug use: No  . Sexual activity: Yes  Other Topics Concern  . Not on file  Social History Narrative  . Not on file   Social Determinants of Health   Financial Resource Strain:   . Difficulty of Paying Living Expenses: Not on file  Food Insecurity:   . Worried About RCharity fundraiserin the Last Year: Not on file  . Ran Out of Food in the Last Year: Not on file  Transportation Needs:   . Lack of Transportation (Medical): Not on file  . Lack of Transportation (Non-Medical): Not on file  Physical Activity:   . Days of Exercise per Week: Not on file  . Minutes of Exercise per Session: Not on file  Stress:   . Feeling of Stress : Not on file  Social Connections:   . Frequency of Communication with Friends  and Family: Not on file  . Frequency of Social Gatherings with Friends and Family: Not on file  . Attends Religious Services: Not on file  . Active Member of Clubs or Organizations: Not on file  . Attends Archivist Meetings: Not on file  . Marital Status: Not on file  Intimate Partner Violence:   . Fear of Current or Ex-Partner: Not on file  . Emotionally Abused: Not on file  . Physically Abused: Not on file  . Sexually Abused: Not on file    Family History  Problem Relation Age of Onset  . Breast cancer Neg Hx     Allergies  Allergen Reactions  . Azithromycin Hives    Hives top to bottom,   they burn and turn into little blisters     Review of Systems   Review of Systems: Negative Unless Checked Constitutional: [] Weight loss  [] Fever  [] Chills Cardiac: [] Chest pain   []  Atrial Fibrillation  [] Palpitations   [] Shortness of breath when laying flat   [] Shortness of breath with exertion. [] Shortness of breath at rest Vascular:  [] Pain in legs with walking   [] Pain in legs with standing [] Pain in legs when laying flat   [] Claudication    [] Pain in feet when laying flat    [] History of DVT   [] Phlebitis   [x] Swelling in legs   [x] Varicose veins   [] Non-healing ulcers Pulmonary:   [] Uses home oxygen   [] Productive cough   [] Hemoptysis   [] Wheeze  [] COPD   [] Asthma Neurologic:  [] Dizziness   [] Seizures  [] Blackouts [] History of stroke   [] History of TIA  [] Aphasia   [] Temporary Blindness   [] Weakness or numbness in arm   [] Weakness or numbness in leg Musculoskeletal:   [] Joint swelling   [] Joint pain   [] Low back pain  []  History of Knee Replacement [] Arthritis [] back Surgeries  []  Spinal Stenosis    Hematologic:  [] Easy bruising  [] Easy bleeding   [] Hypercoagulable state   [] Anemic Gastrointestinal:  [] Diarrhea   [] Vomiting  [x] Gastroesophageal reflux/heartburn   [] Difficulty swallowing. [] Abdominal pain Genitourinary:  [] Chronic kidney disease   [] Difficult urination  [] Anuric   [] Blood in urine [] Frequent urination  [] Burning with urination   [] Hematuria Skin:  [] Rashes   [] Ulcers [] Wounds Psychological:  [] History of anxiety   []  History of major depression  []  Memory Difficulties      OBJECTIVE:   Physical Exam  BP 126/85 (BP Location: Left Arm)   Pulse 91   Resp 16   Wt 164 lb (74.4 kg)   BMI 32.03 kg/m   Gen: WD/WN, NAD Head: [place]/AT, No temporalis wasting.  Ear/Nose/Throat: Hearing grossly intact, nares w/o erythema or drainage Eyes: PER, EOMI, sclera nonicteric.  Neck: Supple, no masses.  No JVD.  Pulmonary:  Good air movement, no use of accessory muscles.  Cardiac:  RRR Vascular:  2+ edema bilaterally Vessel Right Left  Radial Palpable Palpable  Posterior Tibial Palpable Palpable   Gastrointestinal: soft, non-distended. No guarding/no peritoneal signs.  Musculoskeletal: M/S 5/5 throughout.  No deformity or atrophy.  Neurologic: Pain and light touch intact in extremities.  Symmetrical.  Speech is fluent. Motor exam as listed above. Psychiatric: Judgment intact, Mood & affect appropriate for pt's clinical situation. Dermatologic: Stasis dermatitis bilaterally no Ulcers Noted.  No changes consistent with cellulitis. Lymph : No Cervical lymphadenopathy, dermal thickening bilaterally      ASSESSMENT AND PLAN:  1. Lymphedema Recommend:  No surgery or intervention at this point in time.  I have reviewed my previous discussion with the patient regarding swelling and why it causes symptoms.  Patient will continue wearing graduated compression stockings class 1 (20-30 mmHg) on a daily basis. The patient will  beginning wearing the stockings first thing in the morning and removing them in the evening. The patient is instructed specifically not to sleep in the stockings.    In addition, behavioral modification including several periods of elevation of the lower extremities during the day will be continued.  This was reviewed with the patient during the initial visit.  The patient will also continue routine exercise, especially walking on a daily basis as was discussed during the initial visit.    Despite conservative treatments of at least 4 weeks including graduated compression therapy class 1 and behavioral modification including exercise and elevation the patient  has not obtained adequate control of the lymphedema.  The patient still has stage 3 lymphedema and therefore, I believe that a lymph pump should be added to improve the control of the patient's lymphedema.  Additionally, a lymph pump is warranted because it will reduce the risk of cellulitis and  ulceration in the future.  Patient should follow-up in six months    2. Chronic venous insufficiency Patient will continue to follow the outline conservative therapy listed above.   Current Outpatient Medications on File Prior to Visit  Medication Sig Dispense Refill  . acetaminophen (TYLENOL) 325 MG tablet Take 650 mg by mouth every 6 (six) hours as needed.    . cholecalciferol (VITAMIN D3) 25 MCG (1000 UT) tablet Take 1,000 Units by mouth daily.    Marland Kitchen letrozole (FEMARA) 2.5 MG tablet TAKE 1 TABLET BY MOUTH EVERY DAY 90 tablet 2  . levothyroxine (SYNTHROID, LEVOTHROID) 25 MCG tablet TAKE 1 TAB DAILY ON EMPTY STOMACH WITH GLASS OF WATER AT LEAST 30-60 MIN BEFORE BREAKFAST  1   No current facility-administered medications on file prior to visit.    There are no Patient Instructions on file for this visit. No follow-ups on file.   Kris Hartmann, NP  This note was completed with Sales executive.  Any errors are purely unintentional.

## 2019-11-07 NOTE — Progress Notes (Signed)
[place]  [phone] [phone]  ID: [name] OB: 01-06-1976  MR#: [id number]  HFW#:[id number]  Patient Care Team: Dion Body, MD as PCP - General (Family Medicine)  I connected with [name] on 11/07/19 at  2:00 PM EDT by video enabled telemedicine visit and verified that I am speaking with the correct person using two identifiers.   I discussed the limitations, risks, security and privacy concerns of performing an evaluation and management service by telemedicine and the availability of in-person appointments. I also discussed with the patient that there may be a patient responsible charge related to this service. The patient expressed understanding and agreed to proceed.   Other persons participating in the visit and their role in the encounter: Patient, MD.  Patient's location: Home. Provider's location: Clinic.  CHIEF COMPLAINT: Multifocal right breast DCIS  INTERVAL HISTORY: Patient returns to clinic today for routine 68-monthevaluation.  She is having chest wall tenderness just below her right mastectomy scar.  She otherwise feels well.  She continues to tolerate letrozole without significant side effects. She has no neurologic complaints. She denies any recent fevers or illnesses.  She denies any chest pain, shortness of breath, cough, or hemoptysis.  She has no nausea, vomiting, constipation, or diarrhea.  She has a good appetite and denies any weight loss.  She denies any further pelvic pain.  She has no urinary complaints.  Patient offers no further specific complaints today.  REVIEW OF SYSTEMS:   Review of Systems  Constitutional: Negative.  Negative for fever, malaise/fatigue and weight loss.  HENT: Negative.  Negative for congestion.   Respiratory: Negative.  Negative for cough and shortness of breath.   Cardiovascular: Negative.  Negative for chest pain and leg swelling.  Gastrointestinal: Negative.  Negative for  abdominal pain, constipation, diarrhea, nausea and vomiting.  Genitourinary: Negative.  Negative for frequency and urgency.  Musculoskeletal: Negative.  Negative for myalgias.  Skin: Negative.  Negative for rash.  Neurological: Negative.  Negative for dizziness, tingling, sensory change, weakness and headaches.  Psychiatric/Behavioral: Negative.  The patient is not nervous/anxious.     As per HPI. Otherwise, a complete review of systems is negative.  PAST MEDICAL HISTORY: Past Medical History:  Diagnosis Date  . Cancer (HCC)    BREAST  . [place] [address]  BORDERLINE PER PT-MD TOLD PT TO EXERCISE AND DIET-NO MEDS CURRENTLY  . GERD (gastroesophageal reflux disease)   . Hypothyroidism   . Nonalcoholic steatohepatitis     PAST SURGICAL HISTORY: Past Surgical History:  Procedure Laterality Date  . BILATERAL SALPINGECTOMY Bilateral 09/07/2017   Procedure: BILATERAL SALPINGECTOMY;  Surgeon: Schermerhorn, TGwen Her MD;  Location: [facility];  Service: Gynecology;  Laterality: Bilateral;  . BREAST BIOPSY Right 12/02/2015   axillary nodes removed  . BREAST BIOPSY Left 12/02/2015   neg  . EYE SURGERY Left 2009   GROWTH REMOVED  . MASTECTOMY Right   . MASTECTOMY W/ SENTINEL NODE BIOPSY Right 01/20/2016   Procedure: MASTECTOMY WITH SENTINEL LYMPH NODE BIOPSY;  Surgeon: JLeonie Green MD;  Location: [facility];  Service: General;  Laterality: Right;  . SENTINEL NODE BIOPSY Right 01/20/2016   Procedure: SENTINEL NODE BIOPSY;  Surgeon: JLeonie Green MD;  Location: [facility];  Service: General;  Laterality: Right;  . TUBAL LIGATION    . VAGINAL HYSTERECTOMY N/A 09/07/2017   Procedure: HYSTERECTOMY VAGINAL;  Surgeon: Schermerhorn, TGwen Her MD;  Location: [facility];  Service:  Gynecology;  Laterality: N/A;    FAMILY HISTORY: Grandfather and maternal aunt with stomach cancer.     ADVANCED DIRECTIVES:    HEALTH MAINTENANCE: Social History   Tobacco  Use  . Smoking status: Never Smoker  . Smokeless tobacco: Never Used  Substance Use Topics  . Alcohol use: Yes    Comment: OCC  . Drug use: No     Colonoscopy:  PAP:  Bone density:  Lipid panel:  Allergies  Allergen Reactions  . Azithromycin Hives    Hives top to bottom,  they burn and turn into little blisters    Current Outpatient Medications  Medication Sig Dispense Refill  . acetaminophen (TYLENOL) 325 MG tablet Take 650 mg by mouth every 6 (six) hours as needed.    . cholecalciferol (VITAMIN D3) 25 MCG (1000 UT) tablet Take 1,000 Units by mouth daily.    Marland Kitchen letrozole (FEMARA) 2.5 MG tablet TAKE 1 TABLET BY MOUTH EVERY DAY 90 tablet 2  . levothyroxine (SYNTHROID, LEVOTHROID) 25 MCG tablet TAKE 1 TAB DAILY ON EMPTY STOMACH WITH GLASS OF WATER AT LEAST 30-60 MIN BEFORE BREAKFAST  1   No current facility-administered medications for this visit.    OBJECTIVE: There were no vitals filed for this visit.   There is no height or weight on file to calculate BMI.    ECOG FS:0 - Asymptomatic  General: Well-developed, well-nourished, no acute distress. HEENT: Normocephalic. Neuro: Alert, answering all questions appropriately. Cranial nerves grossly intact. Psych: Normal affect.  LAB RESULTS:  Lab Results  Component Value Date   NA 138 09/08/2017   K 3.3 (L) 09/08/2017   CL 107 09/08/2017   CO2 22 09/08/2017   GLUCOSE 102 (H) 09/08/2017   BUN 10 09/08/2017   CREATININE 0.61 09/08/2017   CALCIUM 8.5 (L) 09/08/2017   GFRNONAA >60 09/08/2017   GFRAA >60 09/08/2017    Lab Results  Component Value Date   WBC 10.8 09/08/2017   HGB 13.2 09/08/2017   HCT 38.2 09/08/2017   MCV 84.8 09/08/2017   PLT 284 09/08/2017     STUDIES: No results found.  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  PLAN:    1. Multifocal right breast DCIS: Final pathology did not reveal an invasive component, therefore patient did not require chemotherapy.  Because she  had a total mastectomy, she did not require adjuvant XRT. Tamoxifen was discontinued secondary to side effects.  Continue letrozole for a total of 5 years completing treatment in July 2022.  Patient's most recent left screening mammogram on February 03, 2019 was reported as BI-RADS 1, repeat in July 2021.  Continue routine evaluation every 6 months. 2. Genetic testing: Patient is BCRA 1 and 2 negative. 3.  Bone health: Bone mineral density from September 09, 2018 reported T score of -0.3 which is unchanged from 2 years prior.  Repeat in February 2022.   4.  Chest wall pain: Unclear etiology.  Will get CT of chest to further evaluate.  Patient will then have video assisted follow-up 1 to 2 days later to discuss the results.  I provided 20 minutes of face-to-face video visit time during this encounter which included chart review, counseling, and coordination of care as documented above.    Lloyd Huger, MD 11/07/19 7:02 AM

## 2019-11-13 ENCOUNTER — Inpatient Hospital Stay: Payer: 59 | Attending: Oncology | Admitting: Oncology

## 2019-11-13 ENCOUNTER — Encounter: Payer: Self-pay | Admitting: Oncology

## 2019-11-13 DIAGNOSIS — D0511 Intraductal carcinoma in situ of right breast: Secondary | ICD-10-CM | POA: Diagnosis not present

## 2019-11-13 NOTE — Progress Notes (Signed)
Patient states she has a little pain a few inches down from surgery site (left breast). Rates pain 2 - 3. States noticed some swelling in area. Also states she is having a little burning when urinating. Duration of a week.

## 2019-12-24 ENCOUNTER — Other Ambulatory Visit: Payer: Self-pay | Admitting: General Surgery

## 2019-12-24 DIAGNOSIS — Z1231 Encounter for screening mammogram for malignant neoplasm of breast: Secondary | ICD-10-CM

## 2020-01-19 ENCOUNTER — Telehealth: Payer: Self-pay

## 2020-01-19 NOTE — Telephone Encounter (Signed)
Faxed signed order and last office notes  from Dr. Marla Roe to [facility]. Copy to chart to be scanned.

## 2020-01-25 ENCOUNTER — Other Ambulatory Visit: Payer: Self-pay | Admitting: Oncology

## 2020-02-05 ENCOUNTER — Ambulatory Visit
Admission: RE | Admit: 2020-02-05 | Discharge: 2020-02-05 | Disposition: A | Discharge status: Home / Self Care | Payer: 59 | Source: Ambulatory Visit | Attending: General Surgery | Admitting: General Surgery

## 2020-02-05 DIAGNOSIS — Z1231 Encounter for screening mammogram for malignant neoplasm of breast: Secondary | ICD-10-CM | POA: Insufficient documentation

## 2020-02-11 ENCOUNTER — Other Ambulatory Visit: Payer: Self-pay | Admitting: General Surgery

## 2020-02-11 DIAGNOSIS — N6320 Unspecified lump in the left breast, unspecified quadrant: Secondary | ICD-10-CM

## 2020-02-11 DIAGNOSIS — R928 Other abnormal and inconclusive findings on diagnostic imaging of breast: Secondary | ICD-10-CM

## 2020-02-12 ENCOUNTER — Ambulatory Visit
Admission: RE | Admit: 2020-02-12 | Discharge: 2020-02-12 | Disposition: A | Discharge status: Home / Self Care | Payer: 59 | Source: Ambulatory Visit | Attending: General Surgery | Admitting: General Surgery

## 2020-02-12 DIAGNOSIS — R928 Other abnormal and inconclusive findings on diagnostic imaging of breast: Secondary | ICD-10-CM | POA: Diagnosis present

## 2020-02-12 DIAGNOSIS — N6320 Unspecified lump in the left breast, unspecified quadrant: Secondary | ICD-10-CM | POA: Diagnosis present

## 2020-02-25 ENCOUNTER — Ambulatory Visit (INDEPENDENT_AMBULATORY_CARE_PROVIDER_SITE_OTHER): Payer: 59 | Admitting: Nurse Practitioner

## 2020-03-05 ENCOUNTER — Encounter (INDEPENDENT_AMBULATORY_CARE_PROVIDER_SITE_OTHER): Payer: Self-pay | Admitting: Nurse Practitioner

## 2020-03-05 ENCOUNTER — Ambulatory Visit (INDEPENDENT_AMBULATORY_CARE_PROVIDER_SITE_OTHER): Payer: 59 | Admitting: Nurse Practitioner

## 2020-03-05 ENCOUNTER — Other Ambulatory Visit: Payer: Self-pay

## 2020-03-05 VITALS — BP 130/80 | HR 81 | Resp 16 | Wt 171.4 lb

## 2020-03-05 DIAGNOSIS — I890 Lymphedema, not elsewhere classified: Secondary | ICD-10-CM | POA: Diagnosis not present

## 2020-03-05 DIAGNOSIS — I872 Venous insufficiency (chronic) (peripheral): Secondary | ICD-10-CM

## 2020-03-05 NOTE — Progress Notes (Signed)
Subjective:    Patient ID: Madison Johnson, female    DOB: [birth year], 44 y.o.   MRN: [id number] Chief Complaint  Patient presents with  . Follow-up    19monthfollow up    The patient returns to the office for followup evaluation regarding leg swelling.  The swelling has persisted but with the lymph pump is much, much better controlled. The pain associated with swelling is essentially eliminated. There have not been any interval development of a ulcerations or wounds.  The patient denies problems with the pump, noting it is working well and the leggings are in good condition.  Since the previous visit the patient has been wearing graduated compression stockings and using the lymph pump on a routine basis and  has noted significant improvement in the lymphedema.   Patient stated the lymph pump has been a very positive factor in her care.    Review of Systems  Cardiovascular: Positive for leg swelling.  All other systems reviewed and are negative.      Objective:   Physical Exam Vitals reviewed.  HENT:     Head: Normocephalic.  Cardiovascular:     Rate and Rhythm: Normal rate and regular rhythm.     Pulses: Normal pulses.     Heart sounds: Normal heart sounds.  Pulmonary:     Effort: Pulmonary effort is normal.     Breath sounds: Normal breath sounds.  Skin:    General: Skin is warm and dry.  Neurological:     Mental Status: She is alert and oriented to person, place, and time.  Psychiatric:        Mood and Affect: Mood normal.        Behavior: Behavior normal.        Thought Content: Thought content normal.        Judgment: Judgment normal.     BP 130/80 (BP Location: Right Arm)   Pulse 81   Resp 16   Wt 171 lb 6.4 oz (77.7 kg)   BMI 33.47 kg/m   Past Medical History:  Diagnosis Date  . Cancer (HCC)    BREAST  . [place] [address]  BORDERLINE PER PT-MD TOLD PT TO EXERCISE AND DIET-NO MEDS CURRENTLY  . GERD (gastroesophageal  reflux disease)   . Hypothyroidism   . Nonalcoholic steatohepatitis     Social History   Socioeconomic History  . Marital status: Married    Spouse name: Not on file  . Number of children: Not on file  . Years of education: Not on file  . Highest education level: Not on file  Occupational History  . Not on file  Tobacco Use  . Smoking status: Never Smoker  . Smokeless tobacco: Never Used  Vaping Use  . Vaping Use: Never used  Substance and Sexual Activity  . Alcohol use: Yes    Comment: OCC  . Drug use: No  . Sexual activity: Yes  Other Topics Concern  . Not on file  Social History Narrative  . Not on file   Social Determinants of Health   Financial Resource Strain:   . Difficulty of Paying Living Expenses:   Food Insecurity:   . Worried About RCharity fundraiserin the Last Year:   . RArboriculturistin the Last Year:   Transportation Needs:   . LFilm/video editor(Medical):   .Marland KitchenLack of Transportation (Non-Medical):   Physical Activity:   . Days of  Exercise per Week:   . Minutes of Exercise per Session:   Stress:   . Feeling of Stress :   Social Connections:   . Frequency of Communication with Friends and Family:   . Frequency of Social Gatherings with Friends and Family:   . Attends Religious Services:   . Active Member of Clubs or Organizations:   . Attends Archivist Meetings:   Marland Kitchen Marital Status:   Intimate Partner Violence:   . Fear of Current or Ex-Partner:   . Emotionally Abused:   Marland Kitchen Physically Abused:   . Sexually Abused:     Past Surgical History:  Procedure Laterality Date  . BILATERAL SALPINGECTOMY Bilateral 09/07/2017   Procedure: BILATERAL SALPINGECTOMY;  Surgeon: Schermerhorn, Gwen Her, MD;  Location: [facility];  Service: Gynecology;  Laterality: Bilateral;  . BREAST BIOPSY Right 12/02/2015   axillary nodes removed  . BREAST BIOPSY Left 12/02/2015   neg  . EYE SURGERY Left 2009   GROWTH REMOVED  . MASTECTOMY Right   .  MASTECTOMY W/ SENTINEL NODE BIOPSY Right 01/20/2016   Procedure: MASTECTOMY WITH SENTINEL LYMPH NODE BIOPSY;  Surgeon: Leonie Green, MD;  Location: [facility];  Service: General;  Laterality: Right;  . SENTINEL NODE BIOPSY Right 01/20/2016   Procedure: SENTINEL NODE BIOPSY;  Surgeon: Leonie Green, MD;  Location: [facility];  Service: General;  Laterality: Right;  . TUBAL LIGATION    . VAGINAL HYSTERECTOMY N/A 09/07/2017   Procedure: HYSTERECTOMY VAGINAL;  Surgeon: Schermerhorn, Gwen Her, MD;  Location: [facility];  Service: Gynecology;  Laterality: N/A;    Family History  Problem Relation Age of Onset  . Breast cancer Neg Hx     Allergies  Allergen Reactions  . Azithromycin Hives    Hives top to bottom,  they burn and turn into little blisters       Assessment & Plan:   1. Lymphedema  No surgery or intervention at this point in time.    I have reviewed my discussion with the patient regarding lymphedema and why it  causes symptoms.  Patient will continue wearing graduated compression stockings class 1 (20-30 mmHg) on a daily basis a prescription was given. The patient is reminded to put the stockings on first thing in the morning and removing them in the evening. The patient is instructed specifically not to sleep in the stockings.   In addition, behavioral modification throughout the day will be continued.  This will include frequent elevation (such as in a recliner), use of over the counter pain medications as needed and exercise such as walking.  I have reviewed systemic causes for chronic edema such as liver, kidney and cardiac etiologies and there does not appear to be any significant changes in these organ systems over the past year.  The patient is under the impression that these organ systems are all stable and unchanged.    The patient will continue aggressive use of the  lymph pump.  This will continue to improve the edema control and prevent sequela such as ulcers and  infections.   The patient will follow-up with me on an annual basis.    2. Chronic venous insufficiency Patient will continue with conservative therapy as outlined above.   Current Outpatient Medications on File Prior to Visit  Medication Sig Dispense Refill  . acetaminophen (TYLENOL) 325 MG tablet Take 650 mg by mouth every 6 (six) hours as needed.    . cholecalciferol (VITAMIN D3) 25 MCG (1000 UT)  tablet Take 1,000 Units by mouth daily.    Marland Kitchen letrozole (FEMARA) 2.5 MG tablet TAKE 1 TABLET BY MOUTH EVERY DAY 90 tablet 2  . levothyroxine (SYNTHROID, LEVOTHROID) 25 MCG tablet TAKE 1 TAB DAILY ON EMPTY STOMACH WITH GLASS OF WATER AT LEAST 30-60 MIN BEFORE BREAKFAST  1   No current facility-administered medications on file prior to visit.    There are no Patient Instructions on file for this visit. No follow-ups on file.   Kris Hartmann, NP

## 2020-05-18 ENCOUNTER — Telehealth: Payer: Self-pay

## 2020-05-18 NOTE — Telephone Encounter (Signed)
Faxed signed order for mastectomy supplies

## 2021-02-27 NOTE — Progress Notes (Deleted)
MRN : [id number]  Madison Johnson is a 45 y.o. (10-Mar-1976) female who presents with chief complaint of leg swelling/lymphedema.  History of Present Illness:   The patient returns to the office for followup evaluation regarding leg swelling.  The swelling has persisted but with the lymph pump is much, much better controlled. The pain associated with swelling is essentially eliminated. There have not been any interval development of a ulcerations or wounds.   The patient denies problems with the pump, noting it is working well and the leggings are in good condition.   Since the previous visit the patient has been wearing graduated compression stockings and using the lymph pump on a routine basis and  has noted significant improvement in the lymphedema.   Patient stated the lymph pump has been a very positive factor in her care.   No outpatient medications have been marked as taking for the 02/28/21 encounter (Appointment) with Delana Meyer, Dolores Lory, MD.    Past Medical History:  Diagnosis Date   Cancer ([place])    BREAST   Diabetes mellitus without complication ([place]) 6060   BORDERLINE PER PT-MD TOLD PT TO EXERCISE AND DIET-NO MEDS CURRENTLY   GERD (gastroesophageal reflux disease)    Hypothyroidism    Nonalcoholic steatohepatitis     Past Surgical History:  Procedure Laterality Date   BILATERAL SALPINGECTOMY Bilateral 09/07/2017   Procedure: BILATERAL SALPINGECTOMY;  Surgeon: Boykin Nearing, MD;  Location: [facility];  Service: Gynecology;  Laterality: Bilateral;   BREAST BIOPSY Right 12/02/2015   axillary nodes removed   BREAST BIOPSY Left 12/02/2015   neg   EYE SURGERY Left 2009   GROWTH REMOVED   MASTECTOMY Right    MASTECTOMY W/ SENTINEL NODE BIOPSY Right 01/20/2016   Procedure: MASTECTOMY WITH SENTINEL LYMPH NODE BIOPSY;  Surgeon: Leonie Green, MD;  Location: [facility];  Service: General;  Laterality: Right;   SENTINEL NODE BIOPSY Right 01/20/2016   Procedure: SENTINEL  NODE BIOPSY;  Surgeon: Leonie Green, MD;  Location: [facility];  Service: General;  Laterality: Right;   TUBAL LIGATION     VAGINAL HYSTERECTOMY N/A 09/07/2017   Procedure: HYSTERECTOMY VAGINAL;  Surgeon: Boykin Nearing, MD;  Location: [facility];  Service: Gynecology;  Laterality: N/A;    Social History Social History   Tobacco Use   Smoking status: Never   Smokeless tobacco: Never  Vaping Use   Vaping Use: Never used  Substance Use Topics   Alcohol use: Yes    Comment: OCC   Drug use: No    Family History Family History  Problem Relation Age of Onset   Breast cancer Neg Hx     Allergies  Allergen Reactions   Azithromycin Hives    Hives top to bottom,  they burn and turn into little blisters     REVIEW OF SYSTEMS (Negative unless checked)  Constitutional: [] Weight loss  [] Fever  [] Chills Cardiac: [] Chest pain   [] Chest pressure   [] Palpitations   [] Shortness of breath when laying flat   [] Shortness of breath with exertion. Vascular:  [] Pain in legs with walking   [] Pain in legs at rest  [] History of DVT   [] Phlebitis   [x] Swelling in legs   [] Varicose veins   [] Non-healing ulcers Pulmonary:   [] Uses home oxygen   [] Productive cough   [] Hemoptysis   [] Wheeze  [] COPD   [] Asthma Neurologic:  [] Dizziness   [] Seizures   [] History of stroke   [] History of TIA  [] Aphasia   []   Vissual changes   [] Weakness or numbness in arm   [] Weakness or numbness in leg Musculoskeletal:   [] Joint swelling   [] Joint pain   [] Low back pain Hematologic:  [] Easy bruising  [] Easy bleeding   [] Hypercoagulable state   [] Anemic Gastrointestinal:  [] Diarrhea   [] Vomiting  [] Gastroesophageal reflux/heartburn   [] Difficulty swallowing. Genitourinary:  [] Chronic kidney disease   [] Difficult urination  [] Frequent urination   [] Blood in urine Skin:  [] Rashes   [] Ulcers  Psychological:  [] History of anxiety   []  History of major depression.  Physical Examination  There were no vitals filed for  this visit. There is no height or weight on file to calculate BMI. Gen: WD/WN, NAD Head: [place]/AT, No temporalis wasting.  Ear/Nose/Throat: Hearing grossly intact, nares w/o erythema or drainage, pinna without lesions Eyes: PER, EOMI, sclera nonicteric.  Neck: Supple, no gross masses.  No JVD.  Pulmonary:  Good air movement, no audible wheezing, no use of accessory muscles.  Cardiac: RRR, precordium not hyperdynamic. Vascular:  scattered varicosities present bilaterally.  Mild venous stasis changes to the legs bilaterally.  2+ soft pitting edema  Vessel Right Left  Radial Palpable Palpable  Gastrointestinal: soft, non-distended. No guarding/no peritoneal signs.  Musculoskeletal: M/S 5/5 throughout.  No deformity.  Neurologic: CN 2-12 intact. Pain and light touch intact in extremities.  Symmetrical.  Speech is fluent. Motor exam as listed above. Psychiatric: Judgment intact, Mood & affect appropriate for pt's clinical situation. Dermatologic: Venous rashes no ulcers noted.  No changes consistent with cellulitis. Lymph : No lichenification or skin changes of chronic lymphedema.  CBC Lab Results  Component Value Date   WBC 10.8 09/08/2017   HGB 13.2 09/08/2017   HCT 38.2 09/08/2017   MCV 84.8 09/08/2017   PLT 284 09/08/2017    BMET    Component Value Date/Time   NA 138 09/08/2017 0710   K 3.3 (L) 09/08/2017 0710   CL 107 09/08/2017 0710   CO2 22 09/08/2017 0710   GLUCOSE 102 (H) 09/08/2017 0710   BUN 10 09/08/2017 0710   CREATININE 0.61 09/08/2017 0710   CALCIUM 8.5 (L) 09/08/2017 0710   GFRNONAA >60 09/08/2017 0710   GFRAA >60 09/08/2017 0710   CrCl cannot be calculated (Patient's most recent lab result is older than the maximum 21 days allowed.).  COAG No results found for: INR, PROTIME  Radiology No results found.   Assessment/Plan There are no diagnoses linked to this encounter.   Hortencia Pilar, MD  02/27/2021 2:23 PM

## 2021-02-28 ENCOUNTER — Ambulatory Visit (INDEPENDENT_AMBULATORY_CARE_PROVIDER_SITE_OTHER): Payer: 59 | Admitting: Vascular Surgery

## 2021-02-28 DIAGNOSIS — I872 Venous insufficiency (chronic) (peripheral): Secondary | ICD-10-CM

## 2021-02-28 DIAGNOSIS — I890 Lymphedema, not elsewhere classified: Secondary | ICD-10-CM

## 2021-02-28 DIAGNOSIS — M5416 Radiculopathy, lumbar region: Secondary | ICD-10-CM

## 2021-03-01 ENCOUNTER — Encounter (INDEPENDENT_AMBULATORY_CARE_PROVIDER_SITE_OTHER): Payer: Self-pay | Admitting: Vascular Surgery

## 2021-03-10 ENCOUNTER — Other Ambulatory Visit: Payer: Self-pay | Admitting: Family Medicine

## 2021-03-11 ENCOUNTER — Other Ambulatory Visit: Payer: Self-pay | Admitting: Oncology

## 2021-03-17 ENCOUNTER — Other Ambulatory Visit: Payer: Self-pay | Admitting: Family Medicine

## 2021-03-17 DIAGNOSIS — N644 Mastodynia: Secondary | ICD-10-CM

## 2021-05-11 ENCOUNTER — Other Ambulatory Visit: Payer: Self-pay | Admitting: Oncology

## 2021-05-13 NOTE — Progress Notes (Signed)
[place]  [phone] [phone]) [phone]  ID: CALYSE MURCIA OB: Jul 23, 1976  MR#: [id number]  MVE#:[id number]  Patient Care Team: Dion Body, MD as PCP - General (Family Medicine)  I connected with [name] on 05/20/21 at  3:45 PM EDT by video enabled telemedicine visit and verified that I am speaking with the correct person using two identifiers.   I discussed the limitations, risks, security and privacy concerns of performing an evaluation and management service by telemedicine and the availability of in-person appointments. I also discussed with the patient that there may be a patient responsible charge related to this service. The patient expressed understanding and agreed to proceed.   Other persons participating in the visit and their role in the encounter: Patient, MD.  Patient's location: Home. Provider's location: Clinic.  CHIEF COMPLAINT: Multifocal right breast DCIS  INTERVAL HISTORY: Patient was last evaluated in April 2021.  She returns to clinic today for further evaluation and discussion on discontinuation of letrozole.  She currently feels well and is asymptomatic.  She continues to tolerate treatment well without significant side effects. She has no neurologic complaints. She denies any recent fevers or illnesses.  She denies any chest pain, shortness of breath, cough, or hemoptysis.  She has no nausea, vomiting, constipation, or diarrhea.  She has a good appetite and denies any weight loss.  She denies any further pelvic pain.  She has no urinary complaints.  Patient feels at her baseline offers no specific complaints today.  REVIEW OF SYSTEMS:   Review of Systems  Constitutional: Negative.  Negative for fever, malaise/fatigue and weight loss.  HENT: Negative.  Negative for congestion.   Respiratory: Negative.  Negative for cough and shortness of breath.   Cardiovascular: Negative.  Negative for chest pain and leg swelling.   Gastrointestinal: Negative.  Negative for abdominal pain, constipation, diarrhea, nausea and vomiting.  Genitourinary: Negative.  Negative for frequency and urgency.  Musculoskeletal: Negative.  Negative for myalgias.  Skin: Negative.  Negative for rash.  Neurological: Negative.  Negative for dizziness, tingling, sensory change, weakness and headaches.  Psychiatric/Behavioral: Negative.  The patient is not nervous/anxious.    As per HPI. Otherwise, a complete review of systems is negative.  PAST MEDICAL HISTORY: Past Medical History:  Diagnosis Date   Cancer ([place])    BREAST   Diabetes mellitus without complication ([place]) 2836   BORDERLINE PER PT-MD TOLD PT TO EXERCISE AND DIET-NO MEDS CURRENTLY   GERD (gastroesophageal reflux disease)    Hypothyroidism    Nonalcoholic steatohepatitis     PAST SURGICAL HISTORY: Past Surgical History:  Procedure Laterality Date   BILATERAL SALPINGECTOMY Bilateral 09/07/2017   Procedure: BILATERAL SALPINGECTOMY;  Surgeon: Schermerhorn, Gwen Her, MD;  Location: [facility];  Service: Gynecology;  Laterality: Bilateral;   BREAST BIOPSY Right 12/02/2015   axillary nodes removed   BREAST BIOPSY Left 12/02/2015   neg   EYE SURGERY Left 2009   GROWTH REMOVED   MASTECTOMY Right    MASTECTOMY W/ SENTINEL NODE BIOPSY Right 01/20/2016   Procedure: MASTECTOMY WITH SENTINEL LYMPH NODE BIOPSY;  Surgeon: Leonie Green, MD;  Location: [facility];  Service: General;  Laterality: Right;   SENTINEL NODE BIOPSY Right 01/20/2016   Procedure: SENTINEL NODE BIOPSY;  Surgeon: Leonie Green, MD;  Location: [facility];  Service: General;  Laterality: Right;   TUBAL LIGATION     VAGINAL HYSTERECTOMY N/A 09/07/2017   Procedure: HYSTERECTOMY VAGINAL;  Surgeon: Boykin Nearing, MD;  Location: [facility];  Service: Gynecology;  Laterality: N/A;    FAMILY HISTORY: Grandfather and maternal aunt with stomach cancer.     ADVANCED DIRECTIVES:    HEALTH  MAINTENANCE: Social History   Tobacco Use   Smoking status: Never   Smokeless tobacco: Never  Vaping Use   Vaping Use: Never used  Substance Use Topics   Alcohol use: Yes    Comment: OCC   Drug use: No     Colonoscopy:  PAP:  Bone density:  Lipid panel:  Allergies  Allergen Reactions   Azithromycin Hives    Hives top to bottom,  they burn and turn into little blisters    Current Outpatient Medications  Medication Sig Dispense Refill   acetaminophen (TYLENOL) 325 MG tablet Take 650 mg by mouth every 6 (six) hours as needed.     cholecalciferol (VITAMIN D3) 25 MCG (1000 UT) tablet Take 1,000 Units by mouth daily.     letrozole (FEMARA) 2.5 MG tablet TAKE 1 TABLET BY MOUTH EVERY DAY 90 tablet 2   levothyroxine (SYNTHROID, LEVOTHROID) 25 MCG tablet TAKE 1 TAB DAILY ON EMPTY STOMACH WITH GLASS OF WATER AT LEAST 30-60 MIN BEFORE BREAKFAST  1   pravastatin (PRAVACHOL) 20 MG tablet Take 20 mg by mouth daily.     SUMAtriptan (IMITREX) 50 MG tablet TAKE 1 TABLET BY MOUTH AS DIRECTED FOR MIGRAINE MAY TAKE A SECOND DOSE AFTER 2 HOURS IF NEEDED.     triamcinolone ointment (KENALOG) 0.1 % Apply topically 2 (two) times daily.     No current facility-administered medications for this visit.    OBJECTIVE: There were no vitals filed for this visit.   There is no height or weight on file to calculate BMI.    ECOG FS:0 - Asymptomatic  General: Well-developed, well-nourished, no acute distress. HEENT: Normocephalic. Neuro: Alert, answering all questions appropriately. Cranial nerves grossly intact. Psych: Normal affect.   LAB RESULTS:  Lab Results  Component Value Date   NA 138 09/08/2017   K 3.3 (L) 09/08/2017   CL 107 09/08/2017   CO2 22 09/08/2017   GLUCOSE 102 (H) 09/08/2017   BUN 10 09/08/2017   CREATININE 0.61 09/08/2017   CALCIUM 8.5 (L) 09/08/2017   GFRNONAA >60 09/08/2017   GFRAA >60 09/08/2017    Lab Results  Component Value Date   WBC 10.8 09/08/2017   HGB 13.2  09/08/2017   HCT 38.2 09/08/2017   MCV 84.8 09/08/2017   PLT 284 09/08/2017     STUDIES: No results found.  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  PLAN:    1. Multifocal right breast DCIS: Final pathology did not reveal an invasive component, therefore patient did not require chemotherapy.  Because she had a total mastectomy, she did not require adjuvant XRT. Tamoxifen was discontinued secondary to side effects.  Patient initiated letrozole in July 2017.  She has now completed 5 years of treatment and have recommended she complete her current prescription and then discontinue.  Her last left screening mammogram was on February 05, 2020 was reported as BI-RADS 0, but subsequent ultrasound reported BI-RADS 2.  We will order mammogram in the next 1 to 2 weeks.  After discussion with the patient, it was agreed upon that no further follow-up is necessary.  She will continue to follow-up with her primary care physician with yearly mammograms.  Please refer patient back if there are any questions or concerns.   2. Genetic testing: Patient is BCRA 1  and 2 negative. 3.  Bone health: Bone mineral density from September 09, 2018 reported T score of -0.3 which is unchanged from 2 years prior.  Repeat along with mammogram in the next 1 to 2 weeks.  Follow-up with primary care. 4.  Chest wall pain: Patient does not complain of this today.  Previously, CT scan of the chest was ordered, but imaging was never completed.  I provided 20 minutes of face-to-face video visit time during this encounter which included chart review, counseling, and coordination of care as documented above.    Lloyd Huger, MD 05/20/21 9:45 AM

## 2021-05-19 ENCOUNTER — Inpatient Hospital Stay: Payer: 59 | Attending: Oncology | Admitting: Oncology

## 2021-05-19 DIAGNOSIS — D0511 Intraductal carcinoma in situ of right breast: Secondary | ICD-10-CM | POA: Diagnosis not present

## 2021-05-19 NOTE — Progress Notes (Signed)
Pt confirmed availability for virtual visit as scheduled. Only question is in regards to the need for continuation/discontinuation of medication. No other concerns at this time.

## 2021-05-23 ENCOUNTER — Other Ambulatory Visit: Payer: Self-pay | Admitting: Family Medicine

## 2021-05-23 DIAGNOSIS — N644 Mastodynia: Secondary | ICD-10-CM

## 2021-06-06 ENCOUNTER — Other Ambulatory Visit: Payer: Self-pay

## 2021-06-06 ENCOUNTER — Ambulatory Visit
Admission: RE | Admit: 2021-06-06 | Discharge: 2021-06-06 | Disposition: A | Discharge status: Home / Self Care | Payer: 59 | Source: Ambulatory Visit | Attending: Family Medicine | Admitting: Family Medicine

## 2021-06-06 DIAGNOSIS — N644 Mastodynia: Secondary | ICD-10-CM

## 2021-06-08 ENCOUNTER — Other Ambulatory Visit: Payer: Self-pay | Admitting: Family Medicine

## 2021-06-08 DIAGNOSIS — N6320 Unspecified lump in the left breast, unspecified quadrant: Secondary | ICD-10-CM

## 2021-06-08 DIAGNOSIS — R928 Other abnormal and inconclusive findings on diagnostic imaging of breast: Secondary | ICD-10-CM

## 2021-06-10 ENCOUNTER — Other Ambulatory Visit: Payer: Self-pay

## 2021-06-10 ENCOUNTER — Ambulatory Visit
Admission: RE | Admit: 2021-06-10 | Discharge: 2021-06-10 | Disposition: A | Discharge status: Home / Self Care | Payer: 59 | Source: Ambulatory Visit | Attending: Family Medicine | Admitting: Family Medicine

## 2021-06-10 DIAGNOSIS — N6320 Unspecified lump in the left breast, unspecified quadrant: Secondary | ICD-10-CM | POA: Insufficient documentation

## 2021-06-10 DIAGNOSIS — R928 Other abnormal and inconclusive findings on diagnostic imaging of breast: Secondary | ICD-10-CM

## 2021-06-13 LAB — SURGICAL PATHOLOGY

## 2022-05-17 ENCOUNTER — Emergency Department
Admission: EM | Admit: 2022-05-17 | Discharge: 2022-05-17 | Disposition: A | Discharge status: Home / Self Care | Payer: 59 | Attending: Emergency Medicine | Admitting: Emergency Medicine

## 2022-05-17 ENCOUNTER — Other Ambulatory Visit: Payer: Self-pay

## 2022-05-17 ENCOUNTER — Emergency Department: Payer: 59

## 2022-05-17 ENCOUNTER — Encounter: Payer: Self-pay | Admitting: Emergency Medicine

## 2022-05-17 DIAGNOSIS — Z853 Personal history of malignant neoplasm of breast: Secondary | ICD-10-CM | POA: Diagnosis not present

## 2022-05-17 DIAGNOSIS — R748 Abnormal levels of other serum enzymes: Secondary | ICD-10-CM | POA: Insufficient documentation

## 2022-05-17 DIAGNOSIS — E119 Type 2 diabetes mellitus without complications: Secondary | ICD-10-CM | POA: Diagnosis not present

## 2022-05-17 DIAGNOSIS — R102 Pelvic and perineal pain: Secondary | ICD-10-CM | POA: Diagnosis present

## 2022-05-17 DIAGNOSIS — E039 Hypothyroidism, unspecified: Secondary | ICD-10-CM | POA: Diagnosis not present

## 2022-05-17 DIAGNOSIS — R7401 Elevation of levels of liver transaminase levels: Secondary | ICD-10-CM | POA: Insufficient documentation

## 2022-05-17 DIAGNOSIS — N83201 Unspecified ovarian cyst, right side: Secondary | ICD-10-CM | POA: Diagnosis not present

## 2022-05-17 DIAGNOSIS — R1030 Lower abdominal pain, unspecified: Secondary | ICD-10-CM

## 2022-05-17 DIAGNOSIS — N83202 Unspecified ovarian cyst, left side: Secondary | ICD-10-CM | POA: Insufficient documentation

## 2022-05-17 LAB — COMPREHENSIVE METABOLIC PANEL
ALT: 63 U/L — ABNORMAL HIGH (ref 0–44)
AST: 69 U/L — ABNORMAL HIGH (ref 15–41)
Albumin: 3.8 g/dL (ref 3.5–5.0)
Alkaline Phosphatase: 61 U/L (ref 38–126)
Anion gap: 7 (ref 5–15)
BUN: 18 mg/dL (ref 6–20)
CO2: 21 mmol/L — ABNORMAL LOW (ref 22–32)
Calcium: 9.2 mg/dL (ref 8.9–10.3)
Chloride: 110 mmol/L (ref 98–111)
Creatinine, Ser: 0.67 mg/dL (ref 0.44–1.00)
GFR, Estimated: >60 mL/min (ref >60)
Glucose, Bld: 120 mg/dL — ABNORMAL HIGH (ref 70–99)
Potassium: 4 mmol/L (ref 3.5–5.1)
Sodium: 138 mmol/L (ref 135–145)
Total Bilirubin: 1.2 mg/dL (ref 0.3–1.2)
Total Protein: 8.1 g/dL (ref 6.5–8.1)

## 2022-05-17 LAB — CBC WITH DIFFERENTIAL/PLATELET
Abs Immature Granulocytes: 0.08 10*3/uL — ABNORMAL HIGH (ref 0.00–0.07)
Basophils Absolute: 0.1 10*3/uL (ref 0.0–0.1)
Basophils Relative: 1 %
Eosinophils Absolute: 0.2 10*3/uL (ref 0.0–0.5)
Eosinophils Relative: 2 %
HCT: 44.7 % (ref 36.0–46.0)
Hemoglobin: 14.8 g/dL (ref 12.0–15.0)
Immature Granulocytes: 1 %
Lymphocytes Relative: 31 %
Lymphs Abs: 2.6 10*3/uL (ref 0.7–4.0)
MCH: 28.5 pg (ref 26.0–34.0)
MCHC: 33.1 g/dL (ref 30.0–36.0)
MCV: 86.1 fL (ref 80.0–100.0)
Monocytes Absolute: 0.6 10*3/uL (ref 0.1–1.0)
Monocytes Relative: 7 %
Neutro Abs: 4.8 10*3/uL (ref 1.7–7.7)
Neutrophils Relative %: 58 %
Platelets: 308 10*3/uL (ref 150–400)
RBC: 5.19 MIL/uL — ABNORMAL HIGH (ref 3.87–5.11)
RDW: 13.2 % (ref 11.5–15.5)
WBC: 8.3 10*3/uL (ref 4.0–10.5)
nRBC: 0 % (ref 0.0–0.2)

## 2022-05-17 LAB — URINALYSIS, ROUTINE W REFLEX MICROSCOPIC
Bilirubin Urine: NEGATIVE
Glucose, UA: NEGATIVE mg/dL
Ketones, ur: NEGATIVE mg/dL
Leukocytes,Ua: NEGATIVE
Nitrite: NEGATIVE
Protein, ur: NEGATIVE mg/dL
Specific Gravity, Urine: 1.026 (ref 1.005–1.030)
pH: 5 (ref 5.0–8.0)

## 2022-05-17 LAB — POC URINE PREG, ED: Preg Test, Ur: NEGATIVE

## 2022-05-17 LAB — LIPASE, BLOOD: Lipase: 55 U/L — ABNORMAL HIGH (ref 11–51)

## 2022-05-17 MED ORDER — MELOXICAM 15 MG PO TABS: 15.0000 mg | ORAL_TABLET | Freq: Every day | ORAL | 0 refills | Status: AC

## 2022-05-17 NOTE — ED Notes (Signed)
Pt to D with RLQ and LLQ abdominal pain since August, worse since this past week, pain worse on lower L side.

## 2022-05-17 NOTE — ED Notes (Signed)
Pt to ultrasound

## 2022-05-17 NOTE — ED Triage Notes (Signed)
C/O 'ovary pain" since August.  C/O LLQ pain.  States pain worse over past week.  Has noticed some blood on tissue after urinating as well.  AAOx3.  Skin warm and dry. NAD

## 2022-05-17 NOTE — ED Provider Notes (Signed)
[facility] Provider Note    Event Date/Time   First MD Initiated Contact with Patient 05/17/22 [phone]     (approximate)   History   Chief Complaint Pelvic Pain   HPI Madison Johnson is a 46 y.o. female, history of obesity, nonalcoholic steatohepatitis, right-sided breast cancer (mastectomy performed), heart hypothyroidism, chronic venous insufficiency, diabetes, GERD, presents to the emergency department for evaluation of pelvic pain.  Patient states that she has been having intermittent pelvic pain for the past several months.  She has been monitoring her pain daily, reporting 2/10 to 6/10 pain intermittently pain both her left and right lower quadrants.  She states that she believes that she has ovarian issues right now because she has had ovarian cysts in the past.  She has attempted to get an appointment with her OB/GYN, and she has an appointment scheduled next week, however her pain is recently worsened.  She has additionally had a episode of light vaginal bleeding as well.  She has had a hysterectomy in the past, but still has both of her ovaries.  Denies fever/chills, myalgias, dysuria, hematuria, vaginal discharge, nausea/vomiting, dizziness/lightheadedness, diarrhea, hematochezia, or headache.  History Limitations: No limitations.        Physical Exam  Triage Vital Signs: ED Triage Vitals  Enc Vitals Group     BP 05/17/22 0839 (!) 157/87     Pulse Rate 05/17/22 0839 94     Resp 05/17/22 0839 16     Temp 05/17/22 0839 98.2 F (36.8 C)     Temp Source 05/17/22 0839 Oral     SpO2 05/17/22 0839 100 %     Weight 05/17/22 0840 180 lb (81.6 kg)     Height 05/17/22 0840 5' (1.524 m)     Head Circumference --      Peak Flow --      Pain Score 05/17/22 0846 8     Pain Loc --      Pain Edu? --      Excl. in [place]? --     Most recent vital signs: Vitals:   05/17/22 0839 05/17/22 1306  BP: (!) 157/87 (!) 144/79  Pulse: 94 76  Resp: 16 16  Temp: 98.2  F (36.8 C)   SpO2: 100% 99%    General: Awake, NAD.  Skin: Warm, dry. No rashes or lesions.  Eyes: PERRL. Conjunctivae normal.  CV: Good peripheral perfusion.  Resp: Normal effort.  Abd: Soft, no distention.  Neuro: At baseline. No gross neurological deficits.  Musculoskeletal: Normal ROM of all extremities.  Focused Exam: Slight tenderness appreciated in both the lower left and lower right quadrants, particularly in the pelvic region.  Physical Exam    ED Results / Procedures / Treatments  Labs (all labs ordered are listed, but only abnormal results are displayed) Labs Reviewed  CBC WITH DIFFERENTIAL/PLATELET - Abnormal; Notable for the following components:      Result Value   RBC 5.19 (*)    Abs Immature Granulocytes 0.08 (*)    All other components within normal limits  COMPREHENSIVE METABOLIC PANEL - Abnormal; Notable for the following components:   CO2 21 (*)    Glucose, Bld 120 (*)    AST 69 (*)    ALT 63 (*)    All other components within normal limits  URINALYSIS, ROUTINE W REFLEX MICROSCOPIC - Abnormal; Notable for the following components:   Color, Urine YELLOW (*)    APPearance HAZY (*)  Hgb urine dipstick MODERATE (*)    Bacteria, UA RARE (*)    All other components within normal limits  LIPASE, BLOOD - Abnormal; Notable for the following components:   Lipase 55 (*)    All other components within normal limits  POC URINE PREG, ED     EKG N/A.    RADIOLOGY  ED Provider Interpretation: I personally reviewed and interpreted this ultrasound, ovarian cyst noted on both ovaries approximately 2 to 3 cm in diameter.  US PELVIC COMPLETE W TRANSVAGINAL AND TORSION R/O  Result Date: 05/17/2022 CLINICAL DATA:  Lower abdominal pain, post hysterectomy 2017 EXAM: TRANSABDOMINAL AND TRANSVAGINAL ULTRASOUND OF PELVIS DOPPLER ULTRASOUND OF OVARIES TECHNIQUE: Both transabdominal and transvaginal ultrasound examinations of the pelvis were performed.  Transabdominal technique was performed for global imaging of the pelvis including uterus, ovaries, adnexal regions, and pelvic cul-de-sac. It was necessary to proceed with endovaginal exam following the transabdominal exam to visualize the adnexa. Color and duplex Doppler ultrasound was utilized to evaluate blood flow to the ovaries. COMPARISON:  12/16/2015 FINDINGS: Uterus Surgically absent Endometrium Surgically absent Right ovary Measurements: 3.5 x 2.1 x 3.4 cm = volume: 13.6 mL. Complicated cyst RIGHT ovary 2.8 x 2.3 x 3.0 cm containing scattered low level internal echoes and a septation. No discrete mural nodule. Left ovary Measurements: 3.5 x 2.2 x 5.1 cm = volume: 20.1 mL. Simple cyst LEFT ovary 2.1 x 1.8 x 1.7 cm; no follow-up imaging recommended. Additional small complicated cystic lesion with irregular wall and scattered internal echoes, 2.1 x 1.3 x 2.0 cm, question hemorrhagic cyst or other cystic lesion. Pulsed Doppler evaluation of both ovaries demonstrates normal low-resistance arterial and venous waveforms. Other findings No free pelvic fluid or additional adnexal masses. IMPRESSION: Post hysterectomy. Small simple cyst LEFT ovary 2.1 cm diameter; no follow-up imaging recommended. Complicated cyst RIGHT ovary 3.0 cm greatest size, containing scattered low level internal echoes and a septation. Additional small complicated cystic lesion LEFT ovary 2.1 cm greatest size, question hemorrhagic cyst or other cystic lesion. Ultrasound follow-up of these complicated ovarian lesions is recommended by ultrasound in 6-12 weeks. No evidence of ovarian torsion. Electronically Signed   By: Lavonia Dana M.D.   On: 05/17/2022 12:07    PROCEDURES:  Critical Care performed: N/A.  Procedures    MEDICATIONS ORDERED IN ED: Medications - No data to display   IMPRESSION / MDM / [place] / ED COURSE  I reviewed the triage vital signs and the nursing notes.                               Differential diagnosis includes, but is not limited to, ovarian cyst, ovarian torsion, colitis, ureterolithiasis, pelvic floor muscle strain, cystitis, PID.   ED Course Patient appears well, vitals within normal limits for the patient, NAD.  CBC shows no leukocytosis or anemia.  Urinalysis shows moderate hemoglobin, otherwise no evidence of infection.  Lipase mildly elevated at 55, though clinically not suspicious for pancreatitis.  CMP shows a mild transaminitis with AST of 69 and ALT of 63.  Likely related to underlying nonalcoholic steatohepatitis.   Assessment/Plan Patient presents with lower right and lower left abdominal pain that is been ongoing for the past several months.  Her vitals are within normal limits.  Lab work-up is overall reassuring.  Ultrasound does show complicated cyst on the right ovary approximately 3 cm in diameter.  Additionally has a 2 cm  cyst on the left ovary as well.  No evidence of torsion.  I suspect that this is likely the etiology of her ongoing symptoms.  She has an appointment scheduled with OB/GYN next week.  Encouraged her to continue to follow through with that appointment.  In the meantime, there is no evidence of any serious or life-threatening pathology at this time.  We will provide her with a prescription for meloxicam to help manage her pain.  Will discharge.  Considered admission for this patient, but given her stable presentation and close follow-up, she is unlikely benefit from admission.  Provided the patient with anticipatory guidance, return precautions, and educational material. Encouraged the patient to return to the emergency department at any time if they begin to experience any new or worsening symptoms. Patient expressed understanding and agreed with the plan.   Patient's presentation is most consistent with acute complicated illness / injury requiring diagnostic workup.       FINAL CLINICAL IMPRESSION(S) / ED DIAGNOSES   Final  diagnoses:  Bilateral ovarian cysts     Rx / DC Orders   ED Discharge Orders          Ordered    meloxicam (MOBIC) 15 MG tablet  Daily        05/17/22 1302             Note:  This document was prepared using Dragon voice recognition software and may include unintentional dictation errors.   Teodoro Spray, [place] 05/17/22 1648    Arta Silence, MD 05/23/22 [phone]

## 2022-05-17 NOTE — Discharge Instructions (Addendum)
-  Please follow-up with your OB/GYN on Monday, as discussed.  You will need a follow-up ultrasound in 6 to 12 weeks to monitor the growth of your ovarian cyst.  -You may take both acetaminophen and meloxicam as needed for pain.  Do not take ibuprofen or naproxen while you are taking meloxicam  -Return to the emergency department anytime if you begin to experience any new or worsening symptoms.

## 2022-05-22 ENCOUNTER — Encounter (INDEPENDENT_AMBULATORY_CARE_PROVIDER_SITE_OTHER): Payer: Self-pay

## 2022-06-26 ENCOUNTER — Other Ambulatory Visit: Payer: Self-pay | Admitting: Obstetrics and Gynecology

## 2022-07-06 ENCOUNTER — Other Ambulatory Visit: Payer: 59

## 2022-07-26 ENCOUNTER — Other Ambulatory Visit: Payer: Self-pay | Admitting: Obstetrics and Gynecology

## 2022-08-01 NOTE — H&P (Signed)
Ms. Dercole is a 47 y.o. female here for CPP and L/S eval and possible excision of LOA , vaginal nodule and Left oophorectomy  .   Pt with continue LLQ pelvic pain .  Seasonale OCPs have not changed symptoms , increased h/a on the pill. S/P TVH and bilateral salpingectomy  U/s; Hysterectomy   Complex cyst vs mass vaginal cuff=1.03 x 0.56 x 0.93cm   Rt ovary contains 2 simple cysts:1)2.7cm     2)1.6cm Lt simple ovarian cyst=1.4cm No free fluid seen     Past Medical History:  has a past medical history of Abnormal cytology, Cancer (CMS-HCC) (12/03/2015), Gastroesophageal reflux disease without esophagitis (09/27/2016), Hyperlipidemia, mixed (35/70/1779), Nonalcoholic steatohepatitis (NASH) (09/28/2016), and Thyroid disease.  Past Surgical History:  has a past surgical history that includes Tubal ligation (03/27/2009); Dilation and curettage, diagnostic / therapeutic (10/02/2013); Mastectomy (Right, 01/20/2016); Endometrial ablation (2014); Breast surgery (Right, 11/2015); Hysterectomy (09/07/2017); TVH bilateral salpingectomy (09/07/2017); and Colonoscopy (08/16/2020). Family History: family history includes No Known Problems in her father and mother. Social History:  reports that she has never smoked. She has never been exposed to tobacco smoke. She has never used smokeless tobacco. She reports that she does not currently use alcohol. She reports that she does not use drugs. OB/GYN History:  OB History       Gravida  5   Para  3   Term  3   Preterm      AB  2   Living  3        SAB  2   IAB      Ectopic      Molar      Multiple      Live Births  3             Allergies: is allergic to azithromycin. Medications:   Current Outpatient Medications:    acetaminophen (TYLENOL) 325 MG tablet, Take 650 mg by mouth every 8 (eight) hours as needed, Disp: , Rfl:    ibuprofen (ADVIL,MOTRIN) 200 MG tablet, Take 400 mg by mouth once daily as needed for Pain, Disp: , Rfl:     levonorgestrel-ethinyl estradiol (SEASONALE) 0.15 mg-30 mcg (91) tablet, Take 1 tablet by mouth once daily, Disp: 84 tablet, Rfl: 3   levothyroxine (SYNTHROID) 25 MCG tablet, TAKE 1 TABLET (25 MCG TOTAL) BY MOUTH ONCE DAILY TAKE ON AN EMPTY STOMACH WITH A GLASS OF WATER AT LEAST 30-60 MINUTES BEFORE BREAKFAST., Disp: 90 tablet, Rfl: 1   pravastatin (PRAVACHOL) 20 MG tablet, TAKE 1 TABLET BY MOUTH EVERY DAY AT NIGHT, Disp: 90 tablet, Rfl: 1   SUMAtriptan (IMITREX) 50 MG tablet, Take 1 tablet (50 mg total) by mouth as directed for Migraine MAY TAKE A SECOND DOSE AFTER 2 HOURS IF NEEDED, Disp: 9 tablet, Rfl: 1   triamcinolone 0.1 % ointment, Apply topically 2 (two) times daily, Disp: 453.6 g, Rfl: 1   Review of Systems: General:                      No fatigue or weight loss Eyes:                           No vision changes Ears:                            No hearing difficulty Respiratory:  No cough or shortness of breath Pulmonary:                  No asthma or shortness of breath Cardiovascular:           No chest pain, palpitations, dyspnea on exertion Gastrointestinal:          No abdominal bloating, chronic diarrhea, constipations, masses, pain or hematochezia Genitourinary:             No hematuria, dysuria, abnormal vaginal discharge, pelvic pain, Menometrorrhagia Lymphatic:                   No swollen lymph nodes Musculoskeletal:         No muscle weakness Neurologic:                  No extremity weakness, syncope, seizure disorder Psychiatric:                  No history of depression, delusions or suicidal/homicidal ideation      Exam:       Vitals:  08/02/22   BP: 137/84  Pulse: 94      Body mass index is 34.96 kg/m.   WDWN  female in NAD   Lungs: CTA  CV : RRR without murmur   Breast: exam done in sitting and lying position : No dimpling or retraction, no dominant mass, no spontaneous discharge, no axillary adenopathy Neck:  no thyromegaly Abdomen: soft  , no mass, normal active bowel sounds,  non-tender, no rebound tenderness Pelvic: tanner stage 5 ,  External genitalia: vulva /labia no lesions Urethra: no prolapse Vagina: normal physiologic d/c Mass not palpated on exam . Non tender central vaginal cuff Impression:    The encounter diagnosis was Chronic pelvic pain in female.   Etiology possible PAD Central nodule vagina   Plan:    Offered L/S evaluation and possible excision of vaginal cuff mass and possible Left oophorectomy and LOA if found    She agrees to the surgery .    No follow-ups on file.   Caroline Sauger, MD

## 2022-08-08 ENCOUNTER — Encounter
Admission: RE | Admit: 2022-08-08 | Discharge: 2022-08-08 | Disposition: A | Discharge status: Home / Self Care | Payer: 59 | Source: Ambulatory Visit | Attending: Obstetrics and Gynecology | Admitting: Obstetrics and Gynecology

## 2022-08-08 ENCOUNTER — Other Ambulatory Visit: Payer: Self-pay

## 2022-08-08 DIAGNOSIS — Z01812 Encounter for preprocedural laboratory examination: Secondary | ICD-10-CM

## 2022-08-08 HISTORY — DX: Headache, unspecified: R51.9

## 2022-08-08 NOTE — Patient Instructions (Addendum)
Your procedure is scheduled on: 08/18/22 - Friday Report to the Registration Desk on the 1st floor of the [facility]. To find out your arrival time, please call [phone] between 1PM - 3PM on: 08/17/22 - Thursday If your arrival time is 6:00 am, do not arrive prior to that time as the [facility] entrance doors do not open until 6:00 am.  REMEMBER: Instructions that are not followed completely may result in serious medical risk, up to and including death; or upon the discretion of your surgeon and anesthesiologist your surgery may need to be rescheduled.  Do not eat food after midnight the night before surgery.  No gum chewing, lozengers or hard candies.  You may however, drink CLEAR liquids up to 2 hours before you are scheduled to arrive for your surgery. Do not drink anything within 2 hours of your scheduled arrival time.  Type 1 and Type 2 diabetics should only drink water.  In addition, your doctor has ordered for you to drink the provided  Gatorade G2 Drinking this carbohydrate drink up to two hours before surgery helps to reduce insulin resistance and improve patient outcomes. Please complete drinking 2 hours prior to scheduled arrival time.  TAKE THESE MEDICATIONS THE MORNING OF SURGERY WITH A SIP OF WATER: NONE   One week prior to surgery: Stop Anti-inflammatories (NSAIDS) such as Advil, Aleve, Ibuprofen, Motrin, Naproxen, Naprosyn and Aspirin based products such as Excedrin, Goodys Powder, BC Powder.  Stop ANY OVER THE COUNTER supplements until after surgery.  You may however, continue to take Tylenol if needed for pain up until the day of surgery.  No Alcohol for 24 hours before or after surgery.  No Smoking including e-cigarettes for 24 hours prior to surgery.  No chewable tobacco products for at least 6 hours prior to surgery.  No nicotine patches on the day of surgery.  Do not use any "recreational" drugs for at least a week prior to your surgery.  Please be  advised that the combination of cocaine and anesthesia may have negative outcomes, up to and including death. If you test positive for cocaine, your surgery will be cancelled.  On the morning of surgery brush your teeth with toothpaste and water, you may rinse your mouth with mouthwash if you wish. Do not swallow any toothpaste or mouthwash.  Use CHG Soap or wipes as directed on instruction sheet.  Do not wear jewelry, make-up, hairpins, clips or nail polish.  Do not wear lotions, powders, or perfumes.   Do not shave body from the neck down 48 hours prior to surgery just in case you cut yourself which could leave a site for infection.  Also, freshly shaved skin may become irritated if using the CHG soap.  Contact lenses, hearing aids and dentures may not be worn into surgery.  Do not bring valuables to the hospital. [facility] is not responsible for any missing/lost belongings or valuables.   Notify your doctor if there is any change in your medical condition (cold, fever, infection).  Wear comfortable clothing (specific to your surgery type) to the hospital.  After surgery, you can help prevent lung complications by doing breathing exercises.  Take deep breaths and cough every 1-2 hours. Your doctor may order a device called an Incentive Spirometer to help you take deep breaths. When coughing or sneezing, hold a pillow firmly against your incision with both hands. This is called "splinting." Doing this helps protect your incision. It also decreases belly discomfort.  If  you are being admitted to the hospital overnight, leave your suitcase in the car. After surgery it may be brought to your room.  If you are being discharged the day of surgery, you will not be allowed to drive home. You will need a responsible adult (18 years or older) to drive you home and stay with you that night.   If you are taking public transportation, you will need to have a responsible adult (18 years or  older) with you. Please confirm with your physician that it is acceptable to use public transportation.   Please call the [facility] Dept. at [phone] if you have any questions about these instructions.  Surgery Visitation Policy:  Patients undergoing a surgery or procedure may have two family members or support persons with them as long as the person is not COVID-19 positive or experiencing its symptoms.   Inpatient Visitation:    Visiting hours are 7 a.m. to 8 p.m. Up to four visitors are allowed at one time in a patient room. The visitors may rotate out with other people during the day. One designated support person (adult) may remain overnight.  Due to an increase in RSV and influenza rates and associated hospitalizations, children ages 40 and under will not be able to visit patients in [facility]. Masks continue to be strongly recommended.

## 2022-08-09 ENCOUNTER — Encounter
Admission: RE | Admit: 2022-08-09 | Discharge: 2022-08-09 | Disposition: A | Discharge status: Home / Self Care | Payer: 59 | Source: Ambulatory Visit | Attending: Obstetrics and Gynecology | Admitting: Obstetrics and Gynecology

## 2022-08-09 DIAGNOSIS — Z01818 Encounter for other preprocedural examination: Secondary | ICD-10-CM

## 2022-08-09 DIAGNOSIS — Z01812 Encounter for preprocedural laboratory examination: Secondary | ICD-10-CM | POA: Diagnosis present

## 2022-08-09 LAB — CBC
HCT: 45.9 % (ref 36.0–46.0)
Hemoglobin: 15.4 g/dL — ABNORMAL HIGH (ref 12.0–15.0)
MCH: 28.8 pg (ref 26.0–34.0)
MCHC: 33.6 g/dL (ref 30.0–36.0)
MCV: 86 fL (ref 80.0–100.0)
Platelets: 331 10*3/uL (ref 150–400)
RBC: 5.34 MIL/uL — ABNORMAL HIGH (ref 3.87–5.11)
RDW: 13.2 % (ref 11.5–15.5)
WBC: 8.6 10*3/uL (ref 4.0–10.5)
nRBC: 0 % (ref 0.0–0.2)

## 2022-08-09 LAB — BASIC METABOLIC PANEL
Anion gap: 10 (ref 5–15)
BUN: 15 mg/dL (ref 6–20)
CO2: 21 mmol/L — ABNORMAL LOW (ref 22–32)
Calcium: 8.7 mg/dL — ABNORMAL LOW (ref 8.9–10.3)
Chloride: 106 mmol/L (ref 98–111)
Creatinine, Ser: 0.55 mg/dL (ref 0.44–1.00)
GFR, Estimated: >60 mL/min (ref >60)
Glucose, Bld: 138 mg/dL — ABNORMAL HIGH (ref 70–99)
Potassium: 3.7 mmol/L (ref 3.5–5.1)
Sodium: 137 mmol/L (ref 135–145)

## 2022-08-17 MED ORDER — CHLORHEXIDINE GLUCONATE 0.12 % MT SOLN: 15.0000 mL | Freq: Once | OROMUCOSAL | Status: AC

## 2022-08-17 MED ORDER — POVIDONE-IODINE 10 % EX SWAB
2.0000 | Freq: Once | CUTANEOUS | Status: AC
  Administered 2022-08-18: 2 via TOPICAL

## 2022-08-17 MED ORDER — GABAPENTIN 300 MG PO CAPS: 300.0000 mg | ORAL_CAPSULE | ORAL | Status: AC

## 2022-08-17 MED ORDER — FAMOTIDINE 20 MG PO TABS: 20.0000 mg | ORAL_TABLET | Freq: Once | ORAL | Status: AC

## 2022-08-17 MED ORDER — LACTATED RINGERS IV SOLN: INTRAVENOUS | Status: DC

## 2022-08-17 MED ORDER — SODIUM CHLORIDE 0.9 % IV SOLN: INTRAVENOUS | Status: DC

## 2022-08-17 MED ORDER — ACETAMINOPHEN 500 MG PO TABS: 1000.0000 mg | ORAL_TABLET | ORAL | Status: AC

## 2022-08-17 MED ORDER — ORAL CARE MOUTH RINSE: 15.0000 mL | Freq: Once | OROMUCOSAL | Status: AC

## 2022-08-18 ENCOUNTER — Other Ambulatory Visit: Payer: Self-pay

## 2022-08-18 ENCOUNTER — Encounter
Admission: RE | Disposition: A | Discharge status: Home / Self Care | Payer: Self-pay | Source: Home / Self Care | Attending: Obstetrics and Gynecology

## 2022-08-18 ENCOUNTER — Ambulatory Visit: Payer: 59 | Admitting: Certified Registered Nurse Anesthetist

## 2022-08-18 ENCOUNTER — Ambulatory Visit
Admission: RE | Admit: 2022-08-18 | Discharge: 2022-08-18 | Disposition: A | Discharge status: Home / Self Care | Payer: 59 | Attending: Obstetrics and Gynecology | Admitting: Obstetrics and Gynecology

## 2022-08-18 ENCOUNTER — Ambulatory Visit: Payer: 59 | Admitting: Urgent Care

## 2022-08-18 ENCOUNTER — Encounter: Payer: Self-pay | Admitting: Obstetrics and Gynecology

## 2022-08-18 DIAGNOSIS — K219 Gastro-esophageal reflux disease without esophagitis: Secondary | ICD-10-CM | POA: Diagnosis not present

## 2022-08-18 DIAGNOSIS — N83201 Unspecified ovarian cyst, right side: Secondary | ICD-10-CM | POA: Insufficient documentation

## 2022-08-18 DIAGNOSIS — Z90710 Acquired absence of both cervix and uterus: Secondary | ICD-10-CM | POA: Diagnosis not present

## 2022-08-18 DIAGNOSIS — E039 Hypothyroidism, unspecified: Secondary | ICD-10-CM | POA: Diagnosis not present

## 2022-08-18 DIAGNOSIS — Z01818 Encounter for other preprocedural examination: Secondary | ICD-10-CM

## 2022-08-18 DIAGNOSIS — N736 Female pelvic peritoneal adhesions (postinfective): Secondary | ICD-10-CM | POA: Diagnosis present

## 2022-08-18 DIAGNOSIS — N83202 Unspecified ovarian cyst, left side: Secondary | ICD-10-CM | POA: Insufficient documentation

## 2022-08-18 DIAGNOSIS — Z01812 Encounter for preprocedural laboratory examination: Secondary | ICD-10-CM

## 2022-08-18 DIAGNOSIS — G8929 Other chronic pain: Secondary | ICD-10-CM | POA: Insufficient documentation

## 2022-08-18 LAB — GLUCOSE, CAPILLARY
Glucose-Capillary: 104 mg/dL — ABNORMAL HIGH (ref 70–99)
Glucose-Capillary: 145 mg/dL — ABNORMAL HIGH (ref 70–99)

## 2022-08-18 LAB — TYPE AND SCREEN
ABO/RH(D): O POS
Antibody Screen: NEGATIVE

## 2022-08-18 SURGERY — OOPHORECTOMY, LAPAROSCOPIC
Anesthesia: General | Site: Pelvis | Laterality: Left

## 2022-08-18 MED ORDER — FENTANYL CITRATE (PF) 100 MCG/2ML IJ SOLN
INTRAMUSCULAR | Status: AC
  Administered 2022-08-18: 25 ug via INTRAVENOUS
  Filled 2022-08-18: qty 2

## 2022-08-18 MED ORDER — MIDAZOLAM HCL 2 MG/2ML IJ SOLN
INTRAMUSCULAR | Status: DC | PRN
  Administered 2022-08-18: 2 mg via INTRAVENOUS

## 2022-08-18 MED ORDER — ONDANSETRON HCL 4 MG/2ML IJ SOLN
4.0000 mg | Freq: Once | INTRAMUSCULAR | Status: AC | PRN
  Administered 2022-08-18: 4 mg via INTRAVENOUS

## 2022-08-18 MED ORDER — FAMOTIDINE 20 MG PO TABS
ORAL_TABLET | ORAL | Status: AC
  Administered 2022-08-18: 20 mg via ORAL
  Filled 2022-08-18: qty 1

## 2022-08-18 MED ORDER — BUPIVACAINE HCL 0.5 % IJ SOLN
INTRAMUSCULAR | Status: DC | PRN
  Administered 2022-08-18: 10 mL

## 2022-08-18 MED ORDER — PROPOFOL 10 MG/ML IV BOLUS
INTRAVENOUS | Status: AC
  Filled 2022-08-18: qty 20

## 2022-08-18 MED ORDER — ACETAMINOPHEN 500 MG PO TABS
ORAL_TABLET | ORAL | Status: AC
  Administered 2022-08-18: 1000 mg via ORAL
  Filled 2022-08-18: qty 2

## 2022-08-18 MED ORDER — BUPIVACAINE HCL (PF) 0.5 % IJ SOLN
INTRAMUSCULAR | Status: AC
  Filled 2022-08-18: qty 30

## 2022-08-18 MED ORDER — FLUORESCEIN SODIUM 10 % IV SOLN
INTRAVENOUS | Status: AC
  Filled 2022-08-18: qty 5

## 2022-08-18 MED ORDER — MIDAZOLAM HCL 2 MG/2ML IJ SOLN
INTRAMUSCULAR | Status: AC
  Filled 2022-08-18: qty 2

## 2022-08-18 MED ORDER — ROCURONIUM BROMIDE 100 MG/10ML IV SOLN
INTRAVENOUS | Status: DC | PRN
  Administered 2022-08-18 (×2): 20 mg via INTRAVENOUS
  Administered 2022-08-18: 50 mg via INTRAVENOUS

## 2022-08-18 MED ORDER — SUGAMMADEX SODIUM 200 MG/2ML IV SOLN
INTRAVENOUS | Status: DC | PRN
  Administered 2022-08-18: 200 mg via INTRAVENOUS

## 2022-08-18 MED ORDER — DEXAMETHASONE SODIUM PHOSPHATE 10 MG/ML IJ SOLN
INTRAMUSCULAR | Status: DC | PRN
  Administered 2022-08-18: 10 mg via INTRAVENOUS

## 2022-08-18 MED ORDER — CHLORHEXIDINE GLUCONATE 0.12 % MT SOLN
OROMUCOSAL | Status: AC
  Administered 2022-08-18: 15 mL via OROMUCOSAL
  Filled 2022-08-18: qty 15

## 2022-08-18 MED ORDER — 0.9 % SODIUM CHLORIDE (POUR BTL) OPTIME
TOPICAL | Status: DC | PRN
  Administered 2022-08-18: 500 mL

## 2022-08-18 MED ORDER — ONDANSETRON 4 MG PO TBDP: 4.0000 mg | ORAL_TABLET | Freq: Four times a day (QID) | ORAL | Status: DC | PRN

## 2022-08-18 MED ORDER — GABAPENTIN 300 MG PO CAPS
ORAL_CAPSULE | ORAL | Status: AC
  Administered 2022-08-18: 300 mg via ORAL
  Filled 2022-08-18: qty 1

## 2022-08-18 MED ORDER — ONDANSETRON HCL 4 MG/2ML IJ SOLN
INTRAMUSCULAR | Status: DC | PRN
  Administered 2022-08-18: 4 mg via INTRAVENOUS

## 2022-08-18 MED ORDER — PROPOFOL 10 MG/ML IV BOLUS
INTRAVENOUS | Status: DC | PRN
  Administered 2022-08-18: 150 mg via INTRAVENOUS

## 2022-08-18 MED ORDER — DEXMEDETOMIDINE HCL IN NACL 80 MCG/20ML IV SOLN
INTRAVENOUS | Status: DC | PRN
  Administered 2022-08-18: 4 ug via INTRAVENOUS

## 2022-08-18 MED ORDER — LIDOCAINE HCL (PF) 2 % IJ SOLN
INTRAMUSCULAR | Status: AC
  Filled 2022-08-18: qty 10

## 2022-08-18 MED ORDER — OXYCODONE HCL 5 MG PO TABS
5.0000 mg | ORAL_TABLET | ORAL | Status: DC | PRN
  Administered 2022-08-18: 5 mg via ORAL

## 2022-08-18 MED ORDER — FENTANYL CITRATE (PF) 100 MCG/2ML IJ SOLN
INTRAMUSCULAR | Status: AC
  Filled 2022-08-18: qty 2

## 2022-08-18 MED ORDER — PROPOFOL 1000 MG/100ML IV EMUL
INTRAVENOUS | Status: AC
  Filled 2022-08-18: qty 100

## 2022-08-18 MED ORDER — OXYCODONE HCL 5 MG PO TABS
ORAL_TABLET | ORAL | Status: AC
  Filled 2022-08-18: qty 1

## 2022-08-18 MED ORDER — ONDANSETRON HCL 4 MG/2ML IJ SOLN
INTRAMUSCULAR | Status: AC
  Filled 2022-08-18: qty 2

## 2022-08-18 MED ORDER — FENTANYL CITRATE (PF) 100 MCG/2ML IJ SOLN
25.0000 ug | INTRAMUSCULAR | Status: DC | PRN
  Administered 2022-08-18 (×2): 25 ug via INTRAVENOUS

## 2022-08-18 MED ORDER — PHENYLEPHRINE 80 MCG/ML (10ML) SYRINGE FOR IV PUSH (FOR BLOOD PRESSURE SUPPORT)
PREFILLED_SYRINGE | INTRAVENOUS | Status: DC | PRN
  Administered 2022-08-18 (×5): 80 ug via INTRAVENOUS

## 2022-08-18 MED ORDER — LIDOCAINE HCL (CARDIAC) PF 100 MG/5ML IV SOSY
PREFILLED_SYRINGE | INTRAVENOUS | Status: DC | PRN
  Administered 2022-08-18: 100 mg via INTRAVENOUS

## 2022-08-18 MED ORDER — KETOROLAC TROMETHAMINE 30 MG/ML IJ SOLN
INTRAMUSCULAR | Status: DC | PRN
  Administered 2022-08-18: 30 mg via INTRAVENOUS

## 2022-08-18 MED ORDER — FENTANYL CITRATE (PF) 100 MCG/2ML IJ SOLN
INTRAMUSCULAR | Status: DC | PRN
  Administered 2022-08-18 (×2): 50 ug via INTRAVENOUS

## 2022-08-18 MED ORDER — PHENYLEPHRINE HCL-NACL 20-0.9 MG/250ML-% IV SOLN
INTRAVENOUS | Status: DC | PRN
  Administered 2022-08-18: 40 ug/min via INTRAVENOUS

## 2022-08-18 SURGICAL SUPPLY — 44 items
BAG URINE DRAIN 2000ML AR STRL (UROLOGICAL SUPPLIES) ×1 IMPLANT
BLADE SURG SZ11 CARB STEEL (BLADE) ×2 IMPLANT
CATH ROBINSON RED A/P 16FR (CATHETERS) ×1 IMPLANT
CHLORAPREP W/TINT 26 (MISCELLANEOUS) ×1 IMPLANT
DEFOGGER SCOPE WARMER CLEARIFY (MISCELLANEOUS) IMPLANT
DRSG TEGADERM 2-3/8X2-3/4 SM (GAUZE/BANDAGES/DRESSINGS) ×3 IMPLANT
GAUZE 4X4 16PLY ~~LOC~~+RFID DBL (SPONGE) ×1 IMPLANT
GLOVE SURG SYN 8.0 (GLOVE) ×1 IMPLANT
GLOVE SURG SYN 8.0 PF PI (GLOVE) ×1 IMPLANT
GOWN STRL REUS W/ TWL LRG LVL3 (GOWN DISPOSABLE) ×2 IMPLANT
GOWN STRL REUS W/ TWL XL LVL3 (GOWN DISPOSABLE) ×1 IMPLANT
GOWN STRL REUS W/TWL LRG LVL3 (GOWN DISPOSABLE) ×2
GOWN STRL REUS W/TWL XL LVL3 (GOWN DISPOSABLE) ×1
GRASPER SUT TROCAR 14GX15 (MISCELLANEOUS) ×1 IMPLANT
IRRIGATION STRYKERFLOW (MISCELLANEOUS) ×1 IMPLANT
IRRIGATOR STRYKERFLOW (MISCELLANEOUS) ×1
IV NS 1000ML (IV SOLUTION) ×1
IV NS 1000ML BAXH (IV SOLUTION) ×1 IMPLANT
KIT TURNOVER CYSTO (KITS) ×1 IMPLANT
LABEL OR SOLS (LABEL) ×1 IMPLANT
MANIFOLD NEPTUNE II (INSTRUMENTS) ×1 IMPLANT
NS IRRIG 500ML POUR BTL (IV SOLUTION) ×1 IMPLANT
PACK GYN LAPAROSCOPIC (MISCELLANEOUS) ×1 IMPLANT
PAD OB MATERNITY 4.3X12.25 (PERSONAL CARE ITEMS) ×1 IMPLANT
PAD PREP 24X41 OB/GYN DISP (PERSONAL CARE ITEMS) ×1 IMPLANT
SCRUB CHG 4% DYNA-HEX 4OZ (MISCELLANEOUS) ×1 IMPLANT
SET CYSTO W/LG BORE CLAMP LF (SET/KITS/TRAYS/PACK) IMPLANT
SET TUBE SMOKE EVAC HIGH FLOW (TUBING) ×1 IMPLANT
SHEARS HARMONIC ACE PLUS 36CM (ENDOMECHANICALS) IMPLANT
SLEEVE Z-THREAD 5X100MM (TROCAR) ×1 IMPLANT
SPONGE GAUZE 2X2 8PLY STRL LF (GAUZE/BANDAGES/DRESSINGS) ×3 IMPLANT
STRIP CLOSURE SKIN 1/4X4 (GAUZE/BANDAGES/DRESSINGS) ×1 IMPLANT
SUT VIC AB 0 CT1 36 (SUTURE) ×1 IMPLANT
SUT VIC AB 2-0 UR6 27 (SUTURE) ×1 IMPLANT
SUT VIC AB 4-0 FS2 27 (SUTURE) IMPLANT
SUT VIC AB 4-0 SH 27 (SUTURE) ×1
SUT VIC AB 4-0 SH 27XANBCTRL (SUTURE) ×1 IMPLANT
SWABSTK COMLB BENZOIN TINCTURE (MISCELLANEOUS) ×1 IMPLANT
SYS BAG RETRIEVAL 10MM (BASKET) ×1
SYSTEM BAG RETRIEVAL 10MM (BASKET) ×1 IMPLANT
TRAP FLUID SMOKE EVACUATOR (MISCELLANEOUS) ×1 IMPLANT
TROCAR XCEL NON-BLD 5MMX100MML (ENDOMECHANICALS) ×1 IMPLANT
TROCAR Z-THRD FIOS HNDL 11X100 (TROCAR) ×1 IMPLANT
WATER STERILE IRR 500ML POUR (IV SOLUTION) ×1 IMPLANT

## 2022-08-18 NOTE — Progress Notes (Signed)
Pt here for L/S evaluation , LOA , left oophorectomy and excision of vaginal nodule. LAbs reviewed . All questions answered. Abd marked  Proceed

## 2022-08-18 NOTE — Anesthesia Procedure Notes (Signed)
Procedure Name: Intubation Date/Time: 08/18/2022 1:06 PM  Performed by: Lily Peer, Lavona Norsworthy, CRNAPre-anesthesia Checklist: Patient identified, Emergency Drugs available, Suction available and Patient being monitored Patient Re-evaluated:Patient Re-evaluated prior to induction Oxygen Delivery Method: Circle system utilized Preoxygenation: Pre-oxygenation with 100% oxygen Induction Type: IV induction Ventilation: Mask ventilation without difficulty Laryngoscope Size: McGraph and 3 Grade View: Grade I Tube type: Oral Tube size: 6.5 mm Number of attempts: 1 Airway Equipment and Method: Stylet Placement Confirmation: ETT inserted through vocal cords under direct vision, positive ETCO2 and breath sounds checked- equal and bilateral Secured at: 20 cm Tube secured with: Tape Dental Injury: Teeth and Oropharynx as per pre-operative assessment

## 2022-08-18 NOTE — Op Note (Unsigned)
NAME: Madison, Johnson MEDICAL RECORD NO: [id number] ACCOUNT NO: [id number] DATE OF BIRTH: Sep 11, 1975 FACILITY: [facility] LOCATION: [facility] PHYSICIAN: Boykin Nearing, MD  Operative Report   DATE OF PROCEDURE: 08/18/2022  PREOPERATIVE DIAGNOSIS:  Chronic left sided pelvic pain status post prior hysterectomy.  POSTOPERATIVE DIAGNOSIS:   1.  Significant pelvic adhesive disease. 2.  Chronic pelvic pain, left sided.  PROCEDURES:   1.  Laparoscopic adhesiolysis extensive incorporating 80% of operating time. 2.  Left oophorectomy. 3.  Right ovarian cystotomy. 4.  Cystoscopy.  SURGEON:  Boykin Nearing, MD  FIRST, ASSISTANT:  Benjaman Kindler, MD  SECOND ASSISTANT:  PA student Laurence Ferrari.  ANESTHESIA:  General endotracheal anesthesia.  INDICATIONS:  A 47 year old patient with a prior hysterectomy with left sided pelvic pain that persisted after the surgery, unresponsive to conservative therapy.  The patient complains of dyspareunia as well.  Ultrasound shows a possible 1 cm vaginal  cuff nodule.  DESCRIPTION OF PROCEDURE:  After adequate general endotracheal anesthesia, the patient was placed in dorsal supine position with the legs in the Tyronza stirrups.  The patient's abdomen, perineum and vagina were prepped and draped in normal sterile  fashion.  Timeout was performed.  Foley catheter was placed into the bladder for the procedure and a sponge stick was placed in the vagina to be used for vaginal cuff manipulation for the procedure.  Gloves and gown were changed and attention was  directed to the patient's abdomen where a 5 mm infraumbilical incision was made after injecting with 0.5% Marcaine.  A 5 mm laparoscope was advanced into the abdominal cavity.  The patient's abdomen was insufflated.  Second port was placed left lower  quadrant, 3 cm medial to the left anterior iliac spine.  A 10/11 trocar was advanced without difficulty under direct visualization.  Third port was placed  in the right lower quadrant, again 3 cm medial to the right anterior iliac spine, a 5 mm trocar was  advanced under direct visualization.  Initial impression revealed significant pelvic adhesive disease with epiploica from the colon adhesed to the vaginal cuff, multiple adhesions from the left vaginal cuff to the left sidewall incorporating the left  ovary and bowel adhesed to the left ovary as well.  Harmonic scalpel was brought up and for 80% of total operating time, meticulous dissection of the bowel from the left sidewall and from the cuff ensued.  The infundibulopelvic ligament was cauterized  and transected and the ovary was dissected free from the sidewall.  The ureter could not be identified during the procedure.  Therefore, a cystoscopy was performed at the end of the procedure.  The right ovary was visualized and there was a 3 x 2 cm  clear domed cyst, which was opened and drained.  The patient's abdomen was copiously irrigated and suctioned.  The left ovary was then placed in an Endobag and removed through the left port site.  Intraperitoneal pressure was lowered to 7 mmHg and good hemostasis was noted.  Upper abdomen appeared normal.  The left lower port site was closed with the Carter-Thomason cone apparatus with good closure of the fascia.  Cystoscopy was then performed  with normal saline and the ureteral orifices were identified bilaterally and normal efflux of urine was identified.  No other defects were noted.  Cystoscopy was then completed and the three skin incisions were closed with 4-0 Vicryl suture in  interrupted fashion.  Sterile dressing applied.  There were no complications.  ESTIMATED BLOOD LOSS:  10 mL.  INTRAOPERATIVE FLUIDS:  900 mL.   URINE OUTPUT:  100 mL.   The patient tolerated the procedure well and was taken to recovery room in good condition.     SUJ D: 08/18/2022 6:05:52 pm T: 08/18/2022 10:29:00 pm  JOB: [id number]/ [id number]

## 2022-08-18 NOTE — Discharge Instructions (Addendum)
AMBULATORY SURGERY  DISCHARGE INSTRUCTIONS   The drugs that you were given will stay in your system until tomorrow so for the next 24 hours you should not:  Drive an automobile Make any legal decisions Drink any alcoholic beverage   You may resume regular meals tomorrow.  Today it is better to start with liquids and gradually work up to solid foods.  You may eat anything you prefer, but it is better to start with liquids, then soup and crackers, and gradually work up to solid foods.   Please notify your doctor immediately if you have any unusual bleeding, trouble breathing, redness and pain at the surgery site, drainage, fever, or pain not relieved by medication.    Your post-operative visit with Dr.                                       is: Date:                        Time:    Please call to schedule your post-operative visit.  Additional Instructions:  You received '5mg'$  oxcodone (pain medication at 4pm, you may take next dose if needed at 8pm

## 2022-08-18 NOTE — Transfer of Care (Signed)
Immediate Anesthesia Transfer of Care Note  Patient: Madison Johnson  Procedure(s) Performed: LAPAROSCOPIC EXCISION OF VAGINAL CUFF NODULE, LAPAROSCOPIC LEFT OOPHORECTOMY, WITH CYSTOSCOPY (Left: Pelvis)  Patient Location: PACU  Anesthesia Type:General  Level of Consciousness: awake, alert , and oriented  Airway & Oxygen Therapy: Patient Spontanous Breathing and Patient connected to face mask oxygen  Post-op Assessment: Report given to RN and Post -op Vital signs reviewed and stable  Post vital signs: Reviewed and stable  Last Vitals:  Vitals Value Taken Time  BP 123/57 08/18/22 1516  Temp    Pulse 95 08/18/22 1519  Resp 18 08/18/22 1519  SpO2 96 % 08/18/22 1519  Vitals shown include unvalidated device data.  Last Pain:  Vitals:   08/18/22 1106  TempSrc: Temporal         Complications: No notable events documented.

## 2022-08-18 NOTE — Anesthesia Preprocedure Evaluation (Addendum)
Anesthesia Evaluation  Patient identified by MRN, date of birth, ID band Patient awake    Reviewed: Allergy & Precautions, NPO status , Patient's Chart, lab work & pertinent test results  Airway Mallampati: II  TM Distance: >3 FB Neck ROM: full    Dental  (+) Teeth Intact   Pulmonary neg pulmonary ROS   Pulmonary exam normal breath sounds clear to auscultation       Cardiovascular Exercise Tolerance: Good negative cardio ROS Normal cardiovascular exam Rhythm:Regular Rate:Normal     Neuro/Psych  Headaches negative neurological ROS  negative psych ROS   GI/Hepatic negative GI ROS, Neg liver ROS,GERD  ,,  Endo/Other  diabetes, Type 2Hypothyroidism    Renal/GU      Musculoskeletal   Abdominal  (+) + obese  Peds negative pediatric ROS (+)  Hematology negative hematology ROS (+)   Anesthesia Other Findings Past Medical History: No date: Cancer [facility])     Comment:  BREAST 2018: Diabetes mellitus without complication (HCC)     Comment:  BORDERLINE PER PT-MD TOLD PT TO EXERCISE AND DIET-NO               MEDS CURRENTLY No date: GERD (gastroesophageal reflux disease) No date: Headache No date: Hypothyroidism No date: Nonalcoholic steatohepatitis  Past Surgical History: 09/07/2017: BILATERAL SALPINGECTOMY; Bilateral     Comment:  Procedure: BILATERAL SALPINGECTOMY;  Surgeon:               Boykin Nearing, MD;  Location: [facility] ORS;                Service: Gynecology;  Laterality: Bilateral; 12/02/2015: BREAST BIOPSY; Right     Comment:  axillary nodes removed 12/02/2015: BREAST BIOPSY; Left     Comment:  neg 2009: EYE SURGERY; Left     Comment:  GROWTH REMOVED No date: MASTECTOMY; Right 01/20/2016: MASTECTOMY W/ SENTINEL NODE BIOPSY; Right     Comment:  Procedure: MASTECTOMY WITH SENTINEL LYMPH NODE BIOPSY;                Surgeon: Leonie Green, MD;  Location: [facility] ORS;                Service: General;   Laterality: Right; 01/20/2016: SENTINEL NODE BIOPSY; Right     Comment:  Procedure: SENTINEL NODE BIOPSY;  Surgeon: Leonie Green, MD;  Location: [facility] ORS;  Service: General;                Laterality: Right; No date: TUBAL LIGATION 09/07/2017: VAGINAL HYSTERECTOMY; N/A     Comment:  Procedure: HYSTERECTOMY VAGINAL;  Surgeon: Boykin Nearing, MD;  Location: [facility] ORS;  Service: Gynecology;               Laterality: N/A;  BMI    Body Mass Index: 33.59 kg/m      Reproductive/Obstetrics negative OB ROS                              Anesthesia Physical Anesthesia Plan  ASA: 2  Anesthesia Plan: General   Post-op Pain Management:    Induction: Intravenous  PONV Risk Score and Plan: Ondansetron, Dexamethasone, Midazolam and Treatment may vary due to age or medical condition  Airway Management Planned: Oral ETT  Additional Equipment:  Intra-op Plan:   Post-operative Plan: Extubation in OR  Informed Consent: I have reviewed the patients History and Physical, chart, labs and discussed the procedure including the risks, benefits and alternatives for the proposed anesthesia with the patient or authorized representative who has indicated his/her understanding and acceptance.     Dental Advisory Given  Plan Discussed with: CRNA and Surgeon  Anesthesia Plan Comments:         Anesthesia Quick Evaluation

## 2022-08-18 NOTE — Brief Op Note (Signed)
08/18/2022  2:52 PM  PATIENT:  Madison Johnson  47 y.o. female  PRE-OPERATIVE DIAGNOSIS:  chronic left pelvic pain  POST-OPERATIVE DIAGNOSIS:  chronic left pelvic pain, extensive pelvic ahesions   PROCEDURE:  extensive laparoscopic pelvic adhesiolysis 80% of total operating time  Left oophorectomy , right ovarian cystotomy , cystoscopy   SURGEON:  Surgeon(s) and Role:    * Mina Carlisi, Gwen Her, MD - Primary    * Benjaman Kindler, MD - Assisting  PHYSICIAN ASSISTANT: PA student Laurence Ferrari   ASSISTANTS: none   ANESTHESIA:   general  EBL:  10 mL IOF 900 cc, OU 200cc  BLOOD ADMINISTERED:none  DRAINS: none   LOCAL MEDICATIONS USED:  MARCAINE     SPECIMEN:  Source of Specimen:  left ovary   DISPOSITION OF SPECIMEN:  PATHOLOGY  COUNTS:  YES  TOURNIQUET:  * No tourniquets in log *  DICTATION: .Other Dictation: Dictation Number verbal   PLAN OF CARE: Discharge to home after PACU  PATIENT DISPOSITION:  PACU - hemodynamically stable.   Delay start of Pharmacological VTE agent (>24hrs) due to surgical blood loss or risk of bleeding: not applicable

## 2022-08-18 NOTE — Anesthesia Postprocedure Evaluation (Signed)
Anesthesia Post Note  Patient: Madison Johnson  Procedure(s) Performed: EXTENSIVE LAPAROSCOPIC PELVIC ADHESIOLYSIS , LEFT OOPHORECTOMY, RIGHT OVARIAN CYSTOTOMY, CYSTOSCOPY (Left: Pelvis)  Patient location during evaluation: PACU Anesthesia Type: General Level of consciousness: awake and alert Pain management: pain level controlled Vital Signs Assessment: post-procedure vital signs reviewed and stable Respiratory status: spontaneous breathing, nonlabored ventilation, respiratory function stable and patient connected to nasal cannula oxygen Cardiovascular status: blood pressure returned to baseline and stable Postop Assessment: no apparent nausea or vomiting Anesthetic complications: no   No notable events documented.   Last Vitals:  Vitals:   08/18/22 1638 08/18/22 1657  BP:  131/65  Pulse:    Resp:  15  Temp:  36.4 C  SpO2: 93% 99%    Last Pain:  Vitals:   08/18/22 1657  TempSrc: Temporal  PainSc: 2                  Arita Miss

## 2022-08-22 LAB — SURGICAL PATHOLOGY

## 2022-09-19 ENCOUNTER — Other Ambulatory Visit: Payer: Self-pay | Admitting: Family Medicine

## 2022-09-19 DIAGNOSIS — Z1231 Encounter for screening mammogram for malignant neoplasm of breast: Secondary | ICD-10-CM

## 2022-10-02 ENCOUNTER — Emergency Department: Payer: 59

## 2022-10-02 ENCOUNTER — Emergency Department
Admission: EM | Admit: 2022-10-02 | Discharge: 2022-10-02 | Disposition: A | Discharge status: Home / Self Care | Payer: 59 | Attending: Emergency Medicine | Admitting: Emergency Medicine

## 2022-10-02 ENCOUNTER — Other Ambulatory Visit: Payer: Self-pay

## 2022-10-02 ENCOUNTER — Encounter: Payer: Self-pay | Admitting: Emergency Medicine

## 2022-10-02 DIAGNOSIS — M79662 Pain in left lower leg: Secondary | ICD-10-CM | POA: Diagnosis not present

## 2022-10-02 DIAGNOSIS — E039 Hypothyroidism, unspecified: Secondary | ICD-10-CM | POA: Diagnosis not present

## 2022-10-02 DIAGNOSIS — M79605 Pain in left leg: Secondary | ICD-10-CM

## 2022-10-02 DIAGNOSIS — Z853 Personal history of malignant neoplasm of breast: Secondary | ICD-10-CM | POA: Insufficient documentation

## 2022-10-02 DIAGNOSIS — E119 Type 2 diabetes mellitus without complications: Secondary | ICD-10-CM | POA: Insufficient documentation

## 2022-10-02 LAB — COMPREHENSIVE METABOLIC PANEL
ALT: 70 U/L — ABNORMAL HIGH (ref 0–44)
AST: 59 U/L — ABNORMAL HIGH (ref 15–41)
Albumin: 3.9 g/dL (ref 3.5–5.0)
Alkaline Phosphatase: 76 U/L (ref 38–126)
Anion gap: 5 (ref 5–15)
BUN: 16 mg/dL (ref 6–20)
CO2: 22 mmol/L (ref 22–32)
Calcium: 9.4 mg/dL (ref 8.9–10.3)
Chloride: 108 mmol/L (ref 98–111)
Creatinine, Ser: 0.58 mg/dL (ref 0.44–1.00)
GFR, Estimated: >60 mL/min (ref >60)
Glucose, Bld: 112 mg/dL — ABNORMAL HIGH (ref 70–99)
Potassium: 3.7 mmol/L (ref 3.5–5.1)
Sodium: 135 mmol/L (ref 135–145)
Total Bilirubin: 1 mg/dL (ref 0.3–1.2)
Total Protein: 8.2 g/dL — ABNORMAL HIGH (ref 6.5–8.1)

## 2022-10-02 LAB — CBC WITH DIFFERENTIAL/PLATELET
Abs Immature Granulocytes: 0.08 10*3/uL — ABNORMAL HIGH (ref 0.00–0.07)
Basophils Absolute: 0.1 10*3/uL (ref 0.0–0.1)
Basophils Relative: 1 %
Eosinophils Absolute: 0.2 10*3/uL (ref 0.0–0.5)
Eosinophils Relative: 2 %
HCT: 45.4 % (ref 36.0–46.0)
Hemoglobin: 15.2 g/dL — ABNORMAL HIGH (ref 12.0–15.0)
Immature Granulocytes: 1 %
Lymphocytes Relative: 33 %
Lymphs Abs: 3.3 10*3/uL (ref 0.7–4.0)
MCH: 28.6 pg (ref 26.0–34.0)
MCHC: 33.5 g/dL (ref 30.0–36.0)
MCV: 85.5 fL (ref 80.0–100.0)
Monocytes Absolute: 0.8 10*3/uL (ref 0.1–1.0)
Monocytes Relative: 8 %
Neutro Abs: 5.4 10*3/uL (ref 1.7–7.7)
Neutrophils Relative %: 55 %
Platelets: 348 10*3/uL (ref 150–400)
RBC: 5.31 MIL/uL — ABNORMAL HIGH (ref 3.87–5.11)
RDW: 13.4 % (ref 11.5–15.5)
WBC: 9.9 10*3/uL (ref 4.0–10.5)
nRBC: 0 % (ref 0.0–0.2)

## 2022-10-02 LAB — CK: Total CK: 107 U/L (ref 38–234)

## 2022-10-02 NOTE — ED Provider Notes (Signed)
[facility] Provider Note  Patient Contact: 5:13 PM (approximate)   History   Leg Pain   HPI  Madison Johnson is a 47 y.o. female with history of breast cancer, hypothyroidism and diabetes, presents to the emergency department with swelling and pain to the left inner thigh without overlying erythema.  Patient reports that she has had similar symptoms in the past intermittently but has never had pain to the extent that she has today.  She was evaluated by her PCP in the past and was referred to vascular and they recommended a compression stocking but no other interventions.  Patient denies falls or heavy lifting and has had no prior history of rhabdo.  She did recently have an oophorectomy and patient's husband was concerned for possible DVT.  Patient denies fever or chills at home.  She does state that she has a family history of lung cancer.  No pleuritic chest pain, chest tightness or shortness of breath.      Physical Exam   Triage Vital Signs: ED Triage Vitals  Enc Vitals Group     BP 10/02/22 1427 (!) 157/98     Pulse Rate 10/02/22 1427 (!) 104     Resp 10/02/22 1427 18     Temp 10/02/22 1427 98.4 F (36.9 C)     Temp Source 10/02/22 1427 Oral     SpO2 10/02/22 1427 97 %     Weight 10/02/22 1426 171 lb 15.3 oz (78 kg)     Height 10/02/22 1426 5' (1.524 m)     Head Circumference --      Peak Flow --      Pain Score 10/02/22 1424 5     Pain Loc --      Pain Edu? --      Excl. in [place]? --     Most recent vital signs: Vitals:   10/02/22 1800 10/02/22 1844  BP: 124/89 120/80  Pulse: 97 88  Resp: 20 18  Temp: 98.5 F (36.9 C)   SpO2: 95% 96%     General: Alert and in no acute distress. Eyes:  PERRL. EOMI. Head: No acute traumatic findings ENT:      Nose: No congestion/rhinnorhea.      Mouth/Throat: Mucous membranes are moist.  Neck: No stridor. No cervical spine tenderness to palpation. Cardiovascular:  Good peripheral  perfusion Respiratory: Normal respiratory effort without tachypnea or retractions. Lungs CTAB. Good air entry to the bases with no decreased or absent breath sounds. Gastrointestinal: Bowel sounds 4 quadrants. Soft and nontender to palpation. No guarding or rigidity. No palpable masses. No distention. No CVA tenderness. Musculoskeletal: Full range of motion to all extremities.  Left thigh larger than right with reproducible tenderness to inner thigh palpation. Neurologic:  No gross focal neurologic deficits are appreciated.  Skin:   No rash noted Other:   ED Results / Procedures / Treatments   Labs (all labs ordered are listed, but only abnormal results are displayed) Labs Reviewed  CBC WITH DIFFERENTIAL/PLATELET - Abnormal; Notable for the following components:      Result Value   RBC 5.31 (*)    Hemoglobin 15.2 (*)    Abs Immature Granulocytes 0.08 (*)    All other components within normal limits  COMPREHENSIVE METABOLIC PANEL - Abnormal; Notable for the following components:   Glucose, Bld 112 (*)    Total Protein 8.2 (*)    AST 59 (*)    ALT 70 (*)  All other components within normal limits  CK        RADIOLOGY  I personally viewed and evaluated these images as part of my medical decision making, as well as reviewing the written report by the radiologist.  ED Provider Interpretation:  No signs of DVT on ultrasound. X ray of the left femur shows no acute abnormality.    PROCEDURES:  Critical Care performed: No  Procedures   MEDICATIONS ORDERED IN ED: Medications - No data to display   IMPRESSION / MDM / [place] / ED COURSE  I reviewed the triage vital signs and the nursing notes.                              Assessment and plan:  Leg pain:  47 year old female presents to the emergency department with left inner thigh pain over the past 2 to 3 days and subjective reports of swelling.  Vital signs are reassuring at triage.  On exam, patient  was alert, active and nontoxic-appearing.  CBC, CMP and CK largely within reference range.  Venous ultrasound shows no signs of DVT.  Suspect venous insufficiency, left.  Recommended compression stockings and follow-up with primary care.     FINAL CLINICAL IMPRESSION(S) / ED DIAGNOSES   Final diagnoses:  Pain of left lower extremity     Rx / DC Orders   ED Discharge Orders     None        Note:  This document was prepared using Dragon voice recognition software and may include unintentional dictation errors.   Vallarie Mare [place], PA-C 10/02/22 2002    Naaman Plummer, MD 10/03/22 [phone]

## 2022-10-02 NOTE — ED Triage Notes (Signed)
Pt presents to the ED via POV due to L upper leg pain. Pt states her L thigh is swollen. Pt states she noticed this yesterday. Pt denies any new activity and has been taking it easy for the past 4 weeks due to a surgery. Pt states the L thigh is not red nor bruised and cool to touch. Pt A&Ox4. Pt NAD

## 2022-10-02 NOTE — ED Notes (Signed)
See triage note. Pt is having L upper leg pain. Pt recently had surgery on January 26th took approximately 6 weeks to recover. Pt denies falling or any bruises but is swollen compared to her normal.

## 2022-10-06 ENCOUNTER — Ambulatory Visit
Admission: RE | Admit: 2022-10-06 | Discharge: 2022-10-06 | Disposition: A | Discharge status: Home / Self Care | Payer: 59 | Source: Ambulatory Visit | Attending: Family Medicine | Admitting: Family Medicine

## 2022-10-06 ENCOUNTER — Other Ambulatory Visit: Payer: Self-pay | Admitting: Family Medicine

## 2022-10-06 DIAGNOSIS — Z1231 Encounter for screening mammogram for malignant neoplasm of breast: Secondary | ICD-10-CM | POA: Insufficient documentation
